# Patient Record
Sex: Female | Born: 1995 | Race: Black or African American | Hispanic: No | Marital: Married | Smoking: Never smoker
Health system: Southern US, Community
[De-identification: ages and names within clinical notes are randomized; demographics above are authoritative.]

---

## 2015-02-04 ENCOUNTER — Emergency Department
Admission: EM | Admit: 2015-02-04 | Discharge: 2015-02-04 | Disposition: A | Discharge status: Home / Self Care | Payer: Medicaid Other | Attending: Student | Admitting: Student

## 2015-02-04 ENCOUNTER — Encounter: Payer: Self-pay | Admitting: Emergency Medicine

## 2015-02-04 DIAGNOSIS — S91101A Unspecified open wound of right great toe without damage to nail, initial encounter: Secondary | ICD-10-CM | POA: Diagnosis not present

## 2015-02-04 DIAGNOSIS — Y9289 Other specified places as the place of occurrence of the external cause: Secondary | ICD-10-CM | POA: Diagnosis not present

## 2015-02-04 DIAGNOSIS — Y998 Other external cause status: Secondary | ICD-10-CM | POA: Diagnosis not present

## 2015-02-04 DIAGNOSIS — W109XXA Fall (on) (from) unspecified stairs and steps, initial encounter: Secondary | ICD-10-CM | POA: Diagnosis not present

## 2015-02-04 DIAGNOSIS — Z23 Encounter for immunization: Secondary | ICD-10-CM | POA: Diagnosis not present

## 2015-02-04 DIAGNOSIS — Y9302 Activity, running: Secondary | ICD-10-CM | POA: Insufficient documentation

## 2015-02-04 DIAGNOSIS — S91109A Unspecified open wound of unspecified toe(s) without damage to nail, initial encounter: Secondary | ICD-10-CM

## 2015-02-04 DIAGNOSIS — S99921A Unspecified injury of right foot, initial encounter: Secondary | ICD-10-CM | POA: Diagnosis present

## 2015-02-04 MED ORDER — CEPHALEXIN 250 MG PO CAPS: 250.0000 mg | ORAL_CAPSULE | Freq: Three times a day (TID) | ORAL | Status: AC

## 2015-02-04 MED ORDER — TETANUS-DIPHTH-ACELL PERTUSSIS 5-2.5-18.5 LF-MCG/0.5 IM SUSP
INTRAMUSCULAR | Status: AC
  Filled 2015-02-04: qty 0.5

## 2015-02-04 MED ORDER — TETANUS-DIPHTH-ACELL PERTUSSIS 5-2.5-18.5 LF-MCG/0.5 IM SUSP
0.5000 mL | Freq: Once | INTRAMUSCULAR | Status: AC
  Administered 2015-02-04: 0.5 mL via INTRAMUSCULAR

## 2015-02-04 NOTE — ED Notes (Signed)
Pt states she was running up the steps at home and missed the step and toenail got caught on step and she fell. Happened on Sunday afternoon/evening. Took Advil 200mg  at 11pm last night.  Put peroxide on it last night after she cut it.

## 2015-02-04 NOTE — ED Provider Notes (Signed)
[facility] Emergency Department Provider Note  ____________________________________________  Time seen: 1700  I have reviewed the triage vital signs and the nursing notes.   HISTORY  Chief Complaint Laceration   HPI Beth Newton is a 19 y.o. female today with complaint of right great toe pain. She states she was running up the steps last night when her right great toe caught the step and she fell.  She took Advil one tablet at 11 PM last night. She has used peroxide and it last night. She is unsure of her last tetanus shot. She rates her pain a 3 out of 10 at present. She denies head trauma or loss of consciousness.   History reviewed. No pertinent past medical history.  There are no active problems to display for this patient.   No past surgical history on file.  Current Outpatient Rx  Name  Route  Sig  Dispense  Refill  . cephALEXin (KEFLEX) 250 MG capsule   Oral   Take 1 capsule (250 mg total) by mouth 3 (three) times daily.   21 capsule   0     Allergies Review of patient's allergies indicates no known allergies.  No family history on file.  Social History History  Substance Use Topics  . Smoking status: Never Smoker   . Smokeless tobacco: Not on file  . Alcohol Use: No    Review of Systems Constitutional: No fever/chills Eyes: No visual changes. ENT: No sore throat. Cardiovascular: Denies chest pain. Respiratory: Denies shortness of breath. Gastrointestinal: No abdominal pain.  No nausea, no vomiting. Musculoskeletal: Negative for back pain. Skin: Negative for rash. Neurological: Negative for headaches, focal weakness or numbness.  10-point ROS otherwise negative.  ____________________________________________   PHYSICAL EXAM:  VITAL SIGNS: ED Triage Vitals  Enc Vitals Group     BP 02/04/15 1607 120/78 mmHg     Pulse Rate 02/04/15 1607 81     Resp 02/04/15 1607 18     Temp 02/04/15 1607 98.5 F (36.9 C)     Temp  Source 02/04/15 1607 Oral     SpO2 02/04/15 1607 98 %     Weight 02/04/15 1607 200 lb (90.719 kg)     Height 02/04/15 1607 5\' 7"  (1.702 m)     Head Cir --      Peak Flow --      Pain Score 02/04/15 1613 3     Pain Loc --      Pain Edu? --      Excl. in [place]? --     Constitutional: Alert and oriented. Well appearing and in no acute distress. Eyes: Conjunctivae are normal. PERRL. EOMI. Head: Atraumatic. Nose: No congestion/rhinnorhea. Neck: No stridor.   Respiratory: Normal respiratory effort.  No retractions. Lungs CTAB. Gastrointestinal: Soft and nontender. No distention. Musculoskeletal: No lower extremity tenderness nor edema.  No joint effusions. Neurologic:  Normal speech and language. No gross focal neurologic deficits are appreciated. Speech is normal. No gait instability. Skin:  Skin is warm. On the distal portion of her right great toe there is a very superficial skin avulsion without any active bleeding. Skin is nonviable There is no gross deformity of the toe. Nail appears to be intact. Psychiatric: Mood and affect are normal. Speech and behavior are normal.  ____________________________________________   LABS (all labs ordered are listed, but only abnormal results are displayed)  Labs Reviewed - No data to display   PROCEDURES  Procedure(s) performed: None  Critical Care performed:  No  ____________________________________________   INITIAL IMPRESSION / ASSESSMENT AND PLAN / ED COURSE  Pertinent labs & imaging results that were available during my care of the patient were reviewed by me and considered in my medical decision making (see chart for details).  Patient refuses to allow examiner to touch her toe. There is no gross deformity of the toe. Patient initially refuses her tdap until  explained what this is to prevent. ____________________________________________   FINAL CLINICAL IMPRESSION(S) / ED DIAGNOSES  Final diagnoses:  Avulsion of skin of toe,  initial encounter      Tommi Rumps, PA-C 02/04/15 1716  Gayla Doss, MD 02/04/15 2150

## 2017-06-01 ENCOUNTER — Encounter: Payer: Self-pay | Admitting: Emergency Medicine

## 2017-06-01 ENCOUNTER — Emergency Department
Admission: EM | Admit: 2017-06-01 | Discharge: 2017-06-01 | Disposition: A | Discharge status: Home / Self Care | Payer: Self-pay | Attending: Student in an Organized Health Care Education/Training Program | Admitting: Student in an Organized Health Care Education/Training Program

## 2017-06-01 DIAGNOSIS — J029 Acute pharyngitis, unspecified: Secondary | ICD-10-CM

## 2017-06-01 DIAGNOSIS — J039 Acute tonsillitis, unspecified: Secondary | ICD-10-CM | POA: Insufficient documentation

## 2017-06-01 MED ORDER — AMOXICILLIN 500 MG PO CAPS: 500.0000 mg | ORAL_CAPSULE | Freq: Two times a day (BID) | ORAL | 0 refills | Status: AC

## 2017-06-01 MED ORDER — CHLORHEXIDINE GLUCONATE 0.12% ORAL RINSE (MEDLINE KIT): 15.0000 mL | Freq: Two times a day (BID) | OROMUCOSAL | 0 refills | Status: DC

## 2017-06-01 NOTE — ED Triage Notes (Signed)
Pt reports feels fine, no throat pain but looked in throat and it scared her so came to get checked. No trouble swallowing or breathing.  Tonsils are enlarged with exudate. No fevers.

## 2017-06-01 NOTE — ED Provider Notes (Signed)
[facility] Emergency Department Provider Note   ____________________________________________   I have reviewed the triage vital signs and the nursing notes.   HISTORY  Chief Complaint Sore Throat    HPI Beth Newton is a 21 y.o. female presents to the emergency department with sore throat and swollen lymph nodes bilaterally that she noted developing over the last several days. Patient also noted development of exudate on tonsils. Patient denies a past history of recurrent tonsillitis.Patient denies fever,chills, congestion, sinus pressure,nausea, vomiting or headaches. 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 Patient is also working 40 hours a week. Patient denies any abdominal pain, constipation or diarrhea. Patient denies vision changes, chest pain, chest tightness, shortness of breath.  History reviewed. No pertinent past medical history.  There are no active problems to display for this patient.   History reviewed. No pertinent surgical history.  Prior to Admission medications   Medication Sig Start Date End Date Taking? Authorizing Provider  amoxicillin (AMOXIL) 500 MG capsule Take 1 capsule (500 mg total) by mouth 2 (two) times daily. 06/01/17 06/11/17  Little, Traci M, PA-C  chlorhexidine gluconate, MEDLINE KIT, (PERIDEX) 0.12 % solution Use as directed 15 mLs in the mouth or throat 2 (two) times daily. 06/01/17   Little, Traci M, PA-C    Allergies Patient has no known allergies.  History reviewed. No pertinent family history.  Social History Social History  Substance Use Topics  . Smoking status: Never Smoker  . Smokeless tobacco: Never Used  . Alcohol use No    Review of Systems Constitutional: Negative for fever/chills Eyes: No visual changes. ENT:  Positive for sore throat and negative difficulty swallowing. Inflamed and pain tonsils, bilaterally.  Cardiovascular: Denies chest  pain. Respiratory: Denies cough. Denies shortness of breath. Skin: Negative for rash. Neurological: Negative for headaches.  ____________________________________________   PHYSICAL EXAM:  VITAL SIGNS: ED Triage Vitals  Enc Vitals Group     BP 06/01/17 1641 137/81     Pulse Rate 06/01/17 1641 (!) 106     Resp 06/01/17 1641 18     Temp 06/01/17 1641 99.5 F (37.5 C)     Temp Source 06/01/17 1641 Oral     SpO2 06/01/17 1641 100 %     Weight 06/01/17 1642 200 lb (90.7 kg)     Height 06/01/17 1642 5' 7" (1.702 m)     Head Circumference --      Peak Flow --      Pain Score --      Pain Loc --      Pain Edu? --      Excl. in [place]? --     Constitutional: Alert and oriented. Well appearing and in no acute distress.  Eyes: Conjunctivae are normal. PERRL. EOMI  Head: Normocephalic and atraumatic. ENT:      Ears: Canals clear. TMs intact bilaterally.      Nose: No congestion/rhinnorhea.      Mouth/Throat: Mucous membranes are moist. Oropharynx erythematous. Tonsils erythematous, edematous with exudate. Uvula midline. Neck:Supple. No thyromegaly. No stridor.  Cardiovascular: Normal rate, regular rhythm. Normal S1 and S2.  Good peripheral circulation. Respiratory: nonproductive cough.Normal respiratory effort without tachypnea or retractions. Lungs CTAB. No wheezes/rales/rhonchi. Good air entry to the bases with no decreased or absent breath sounds. Hematological/Lymphatic/Immunological: No cervical lymphadenopathy. Cardiovascular: Normal rate, regular rhythm. Normal distal pulses. Gastrointestinal: Bowel sounds 4 quadrants. Soft and nontender to palpation.  Neurologic: Normal speech and  language.  Skin:  Skin is warm, dry and intact. No rash noted. Psychiatric: Mood and affect are normal. Speech and behavior are normal. Patient exhibits appropriate insight and judgement.  ____________________________________________   LABS (all labs ordered are listed, but only abnormal results are  displayed)  Labs Reviewed - No data to display ____________________________________________  EKG none ____________________________________________  RADIOLOGY none ____________________________________________   PROCEDURES  Procedure(s) performed: no    Critical Care performed: no ____________________________________________   INITIAL IMPRESSION / ASSESSMENT AND PLAN / ED COURSE  Pertinent labs & imaging results that were available during my care of the patient were reviewed by me and considered in my medical decision making (see chart for details).  Patient presents to the emergency department sore throat, cough, nasal congestion and edematous symmetrical tonsils with exudate. History and physical exam are reassuring symptoms are consistent with acute tonsillitis. Patient will be prescribed amoxicillin and advised to take over Ibuprofen or Tylenol for pain and fever as needed. Patient will also be encourage to rest, and rehydrate as a part of supportive care. Physical exam and vital signs were reassuring during reassessment prior to discharge. Patient informed of clinical course, understand medical decision-making process, and agree with plan. Patient was advised to follow up with PCP and was also advised to return to the emergency department for symptoms that change or worsen.     ____________________________________________   FINAL CLINICAL IMPRESSION(S) / ED DIAGNOSES  Final diagnoses:  Tonsillitis  Sore throat       NEW MEDICATIONS STARTED DURING THIS VISIT:  Discharge Medication List as of 06/01/2017  6:11 PM    START taking these medications   Details  amoxicillin (AMOXIL) 500 MG capsule Take 1 capsule (500 mg total) by mouth 2 (two) times daily., Starting Tue 06/01/2017, Until Fri 06/11/2017, Print         Note:  This document was prepared using Dragon voice recognition software and may include unintentional dictation errors.    Little, Laroy Apple,  PA-C 06/01/17 Lona Kettle    Merlyn Lot, MD 06/03/17 [phone]

## 2017-06-01 NOTE — Discharge Instructions (Signed)
Take medication as prescribed. Return to emergency department if symptoms worsen and follow-up with PCP as needed.    Get help right away if: You have any new symptoms such as: Vomiting Very bad headache Stiff neck Chest pain Trouble breathing or swallowing You have very bad throat pain and also have drooling or voice changes. You have very bad pain that is not helped by medicine. You cannot fully open your mouth. You have redness, swelling, or severe pain anywhere in your neck.

## 2017-06-01 NOTE — ED Notes (Signed)
Pt stating that she came in because her "tonsils are swollen and I'm freaking out about it." Pt stating that she feels perfectly fine but she just can see that her tonsils are enlarged.

## 2018-09-18 ENCOUNTER — Encounter (HOSPITAL_COMMUNITY): Payer: Self-pay | Admitting: Emergency Medicine

## 2018-09-18 ENCOUNTER — Other Ambulatory Visit: Payer: Self-pay

## 2018-09-18 ENCOUNTER — Emergency Department (HOSPITAL_BASED_OUTPATIENT_CLINIC_OR_DEPARTMENT_OTHER): Payer: Managed Care, Other (non HMO)

## 2018-09-18 ENCOUNTER — Emergency Department (HOSPITAL_COMMUNITY)
Admission: EM | Admit: 2018-09-18 | Discharge: 2018-09-18 | Disposition: A | Discharge status: Home / Self Care | Payer: Managed Care, Other (non HMO) | Attending: Emergency Medicine | Admitting: Emergency Medicine

## 2018-09-18 ENCOUNTER — Encounter (HOSPITAL_COMMUNITY): Payer: Self-pay | Admitting: Pharmacy Technician

## 2018-09-18 ENCOUNTER — Ambulatory Visit (INDEPENDENT_AMBULATORY_CARE_PROVIDER_SITE_OTHER)
Admission: EM | Admit: 2018-09-18 | Discharge: 2018-09-18 | Disposition: A | Discharge status: Home / Self Care | Payer: Managed Care, Other (non HMO) | Source: Home / Self Care

## 2018-09-18 DIAGNOSIS — M7989 Other specified soft tissue disorders: Secondary | ICD-10-CM

## 2018-09-18 DIAGNOSIS — M79604 Pain in right leg: Secondary | ICD-10-CM | POA: Insufficient documentation

## 2018-09-18 DIAGNOSIS — R52 Pain, unspecified: Secondary | ICD-10-CM

## 2018-09-18 DIAGNOSIS — A46 Erysipelas: Secondary | ICD-10-CM | POA: Diagnosis not present

## 2018-09-18 MED ORDER — CEPHALEXIN 250 MG PO CAPS
500.0000 mg | ORAL_CAPSULE | Freq: Once | ORAL | Status: AC
  Administered 2018-09-18: 500 mg via ORAL
  Filled 2018-09-18: qty 2

## 2018-09-18 MED ORDER — CEPHALEXIN 500 MG PO CAPS: 500.0000 mg | ORAL_CAPSULE | Freq: Four times a day (QID) | ORAL | 0 refills | Status: AC

## 2018-09-18 NOTE — ED Triage Notes (Signed)
Pt arrives via POV with reports of redness, swelling and pain to RLE onset several weeks ago. Denies CP and SOB.

## 2018-09-18 NOTE — ED Notes (Signed)
Patient verbalizes understanding of discharge instructions. Opportunity for questioning and answers were provided. 

## 2018-09-18 NOTE — ED Provider Notes (Signed)
[facility]ENC[facility] Provider Note   CSN: [id number] Arrival date & time: 09/18/18  1155     History   Chief Complaint Chief Complaint  Patient presents with  . Leg Pain    HPI Beth Newton is a 23 y.o. female.  HPI   She presents for evaluation of pain and swelling in the right leg present for 2 weeks without known trauma.  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  She has been trying to elevate the leg to help decrease the swelling without improvement.  She denies fever, chills, cough, shortness of breath, weakness or dizziness.  No prior similar problems.  There are no other known modifying factors.  History reviewed. No pertinent past medical history.  There are no active problems to display for this patient.   History reviewed. No pertinent surgical history.   OB History   No obstetric history on file.      Home Medications    Prior to Admission medications   Medication Sig Start Date End Date Taking? Authorizing Provider  cephALEXin (KEFLEX) 500 MG capsule Take 1 capsule (500 mg total) by mouth 4 (four) times daily for 7 days. 09/18/18 09/25/18  Little, Ambrose Finlandachel Morgan, MD    Family History No family history on file.  Social History Social History   Tobacco Use  . Smoking status: Never Smoker  . Smokeless tobacco: Never Used  Substance Use Topics  . Alcohol use: No  . Drug use: Never     Allergies   Patient has no known allergies.   Review of Systems Review of Systems  All other systems reviewed and are negative.    Physical Exam Updated Vital Signs BP 101/80   Pulse 88   Temp 99.1 F (37.3 C) (Oral)   Resp 16   LMP 08/31/2018   SpO2 100%   Physical Exam Vitals signs and nursing note reviewed.  Constitutional:      Appearance: She is well-developed.  HENT:     Head: Normocephalic and atraumatic.     Right Ear: External ear normal.     Left  Ear: External ear normal.  Eyes:     Conjunctiva/sclera: Conjunctivae normal.     Pupils: Pupils are equal, round, and reactive to light.  Neck:     Musculoskeletal: Normal range of motion and neck supple.     Trachea: Phonation normal.  Cardiovascular:     Rate and Rhythm: Normal rate.  Pulmonary:     Effort: Pulmonary effort is normal.  Musculoskeletal: Normal range of motion.     Comments: Right lower leg tender and swollen from knee to foot.  Reddened area anterior mid shin, indistinct margins, extending to the right lateral midfoot.  Skin:    General: Skin is warm and dry.  Neurological:     Mental Status: She is alert and oriented to person, place, and time.     Cranial Nerves: No cranial nerve deficit.     Sensory: No sensory deficit.     Motor: No abnormal muscle tone.     Coordination: Coordination normal.  Psychiatric:        Behavior: Behavior normal.        Thought Content: Thought content normal.        Judgment: Judgment normal.      ED Treatments / Results  Labs (all labs ordered are listed, but only abnormal results are displayed) Labs  Reviewed - No data to display  EKG None  Radiology Vas [place] Lower Extremity Venous (dvt)  Result Date: 09/19/2018  Lower Venous Study Indications: Pain, and Swelling.  Comparison Study: No prior study on file Performing Technologist: Sherren Kerns RVS  Examination Guidelines: A complete evaluation includes B-mode imaging, spectral Doppler, color Doppler, and power Doppler as needed of all accessible portions of each vessel. Bilateral testing is considered an integral part of a complete examination. Limited examinations for reoccurring indications may be performed as noted.  Right Venous Findings: +---------+---------------+---------+-----------+----------+-------+          CompressibilityPhasicitySpontaneityPropertiesSummary +---------+---------------+---------+-----------+----------+-------+ CFV      Full           Yes       Yes                          +---------+---------------+---------+-----------+----------+-------+ SFJ      Full                                                 +---------+---------------+---------+-----------+----------+-------+ FV Prox  Full                                                 +---------+---------------+---------+-----------+----------+-------+ FV Mid   Full                                                 +---------+---------------+---------+-----------+----------+-------+ FV DistalFull                                                 +---------+---------------+---------+-----------+----------+-------+ PFV      Full                                                 +---------+---------------+---------+-----------+----------+-------+ POP      Full           Yes      Yes                          +---------+---------------+---------+-----------+----------+-------+ PTV      Full                                                 +---------+---------------+---------+-----------+----------+-------+ PERO     Full                                                 +---------+---------------+---------+-----------+----------+-------+ GSV      Full                                                 +---------+---------------+---------+-----------+----------+-------+  SSV      Full                                                 +---------+---------------+---------+-----------+----------+-------+  Left Venous Findings: +---+---------------+---------+-----------+----------+-------+    CompressibilityPhasicitySpontaneityPropertiesSummary +---+---------------+---------+-----------+----------+-------+ CFVFull           Yes      Yes                          +---+---------------+---------+-----------+----------+-------+    Summary: Right: There is no evidence of deep vein thrombosis in the lower extremity.There is no evidence of superficial  venous thrombosis. Ultrasound characteristics of enlarged lymph nodes are noted in the groin.  *See table(s) above for measurements and observations. Electronically signed by Waverly Ferrari MD on 09/19/2018 at 10:43:45 AM.    Final     Procedures Procedures (including critical care time)  Medications Ordered in ED Medications  cephALEXin (KEFLEX) capsule 500 mg (500 mg Oral Given 09/18/18 1558)     Initial Impression / Assessment and Plan / ED Course  I have reviewed the triage vital signs and the nursing notes.  Pertinent labs & imaging results that were available during my care of the patient were reviewed by me and considered in my medical decision making (see chart for details).      Patient Vitals for the past 24 hrs:  BP Temp Temp src Pulse Resp SpO2  09/18/18 1809 101/80 99.1 F (37.3 C) Oral 88 16 100 %  09/18/18 1745 - - - 86 - 100 %  09/18/18 1730 - - - 96 - 100 %  09/18/18 1715 - - - 93 - 100 %  09/18/18 1700 - - - 90 - 100 %  09/18/18 1645 - - - (!) 105 - 100 %  09/18/18 1630 - - - 86 - 100 %  09/18/18 1615 - - - 88 - 100 %  09/18/18 1600 - - - 100 - 100 %  09/18/18 1545 - - - 64 - 100 %  09/18/18 1530 - - - 72 - 100 %  09/18/18 1515 - - - 88 - 100 %  09/18/18 1300 97/73 - - 94 - 100 %  09/18/18 1220 - - - 99 - 100 %  09/18/18 1215 120/78 - - 92 - 100 %      Medical Decision Making: Right leg swelling and redness with indolent course. Clinically has appearance of streptococcal infection, erysipelas. DVT study ordered for occult thrombus findings. Doubt sepsis, or metabolic instability.  CRITICAL CARE- No Performed by: Mancel Bale   Nursing Notes Reviewed/ Care Coordinated Applicable Imaging Reviewed Interpretation of Laboratory Data incorporated into ED treatment'  Plan; Care yo Dr. Clarene Duke to dispo after U/S    Final Clinical Impressions(s) / ED Diagnoses   Final diagnoses:  Erysipelas of right lower extremity    ED Discharge Orders          Ordered    cephALEXin (KEFLEX) 500 MG capsule  4 times daily     09/18/18 1755           Mancel Bale, MD 09/19/18 1101

## 2018-09-18 NOTE — ED Provider Notes (Signed)
I received this patient in signout from Dr. Effie Shy.  We were awaiting DVT ultrasound results which were negative for thrombosis.  Marked patient's skin with skin marker to allow her to assess for improvement or worsening symptoms at home.  Per Dr. Mable Paris instructions, will provide with keflex to treat erysipelas.  I discussed supportive measures including the importance of elevating leg and staying off of leg as often as possible.  Extensively reviewed return precautions including any worsening symptoms or new symptoms such as fever.  She voiced understanding.   Little, Ambrose Finland, MD 09/18/18 1757

## 2018-09-18 NOTE — ED Provider Notes (Signed)
[facility]    CSN: [id number] Arrival date & time: 09/18/18  1016     History   Chief Complaint Chief Complaint  Patient presents with  . Ankle Pain    HPI Beth Newton is a 23 y.o. female.   Patient is a 23 year old female and is otherwise healthy.  She presents with right lower extremity swelling, tenderness, erythema, increased warmth.  This has  been present and worsening over the past week.  She had an injury at work approximately 3 weeks ago where she was lifting up a box and felt a pull in her right calf area.  She was treating the injury with heat.  She does stand on her feet long hours at work.  She is still able to ambulate on the leg.  There is no open wounds or rashes.  She has a family history of DVTs.  She denies any hormone use, smoking, recent long distance traveling.  She denies any chest pain, shortness of breath, fevers.  ROS per HPI      History reviewed. No pertinent past medical history.  There are no active problems to display for this patient.   History reviewed. No pertinent surgical history.  OB History   No obstetric history on file.      Home Medications    Prior to Admission medications   Not on File    Family History History reviewed. No pertinent family history.  Social History Social History   Tobacco Use  . Smoking status: Never Smoker  . Smokeless tobacco: Never Used  Substance Use Topics  . Alcohol use: No  . Drug use: Never     Allergies   Patient has no known allergies.   Review of Systems Review of Systems   Physical Exam Triage Vital Signs ED Triage Vitals  Enc Vitals Group     BP 09/18/18 1112 139/75     Pulse Rate 09/18/18 1112 (!) 103     Resp 09/18/18 1112 18     Temp 09/18/18 1112 99 F (37.2 C)     Temp Source 09/18/18 1112 Oral     SpO2 09/18/18 1112 100 %     Weight --      Height --      Head Circumference --      Peak Flow --      Pain Score 09/18/18 1113 9     Pain Loc --       Pain Edu? --      Excl. in GC? --    No data found.  Updated Vital Signs BP 139/75 (BP Location: Right Arm)   Pulse (!) 103   Temp 99 F (37.2 C) (Oral)   Resp 18   LMP 08/31/2018   SpO2 100%   Visual Acuity Right Eye Distance:   Left Eye Distance:   Bilateral Distance:    Right Eye Near:   Left Eye Near:    Bilateral Near:     Physical Exam Vitals signs and nursing note reviewed.  Constitutional:      General: She is not in acute distress.    Appearance: Normal appearance. She is not toxic-appearing.  HENT:     Head: Normocephalic and atraumatic.  Eyes:     Conjunctiva/sclera: Conjunctivae normal.  Neck:     Musculoskeletal: Normal range of motion.  Cardiovascular:     Rate and Rhythm: Tachycardia present.     Heart sounds: Normal heart sounds.  Pulmonary:  Effort: Pulmonary effort is normal.     Breath sounds: Normal breath sounds.  Musculoskeletal:        General: Swelling and tenderness present.     Right lower leg: Edema present.     Comments: Tenderness to the right calf and anterior lower leg.  There is erythema and increased warmth.  Sensation and pedal pulse present. Negative Homans. Left lower extremity appears normal without tenderness but there is some mild swelling at the lateral malleolus.  Skin:    Findings: Erythema present.     Comments: See picture for detail.  Neurological:     Mental Status: She is alert.  Psychiatric:        Mood and Affect: Mood normal.        UC Treatments / Results  Labs (all labs ordered are listed, but only abnormal results are displayed) Labs Reviewed - No data to display  EKG None  Radiology No results found.  Procedures Procedures (including critical care time)  Medications Ordered in UC Medications - No data to display  Initial Impression / Assessment and Plan / UC Course  I have reviewed the triage vital signs and the nursing notes.  Pertinent labs & imaging results that were  available during my care of the patient were reviewed by me and considered in my medical decision making (see chart for details).    Cellulitis vs DVT Sending patient to the ER to rule out DVT based on Patient family history and physical exam.  Final Clinical Impressions(s) / UC Diagnoses   Final diagnoses:  Leg swelling  Pain of right lower extremity     Discharge Instructions     I would like  for you to go to the emergency room for further evaluation and rule out a blood clot in your leg.     ED Prescriptions    None     Controlled Substance Prescriptions [place] Controlled Substance Registry consulted? Not Applicable   Janace Aris, NP 09/18/18 1152

## 2018-09-18 NOTE — ED Triage Notes (Signed)
The patient presented to the [facility] with a complaint of right lower leg and ankle pain x 3 weeks. The patient reported that she lifted a box at work earlier and it has been hurting since. The patient presented to the [facility] with redness, swelling, and heat to the calf area.

## 2018-09-18 NOTE — Progress Notes (Signed)
VASCULAR LAB PRELIMINARY  PRELIMINARY  PRELIMINARY  PRELIMINARY  Right lower extremity venous duplex completed.    Preliminary report:  There is no DVT or SVT noted in the right lower extremity.  There is an enlarged lymph node noted in the right groin.  Right calf muscle tissue appears disturbed.  Gave results to Dr. Alcide Goodness, Riordan Walle, RVT 09/18/2018, 5:07 PM

## 2018-09-18 NOTE — Discharge Instructions (Addendum)
I would like  for you to go to the emergency room for further evaluation and rule out a blood clot in your leg.

## 2018-10-10 ENCOUNTER — Encounter (HOSPITAL_COMMUNITY): Payer: Self-pay | Admitting: Emergency Medicine

## 2018-10-10 ENCOUNTER — Ambulatory Visit (HOSPITAL_COMMUNITY)
Admission: EM | Admit: 2018-10-10 | Discharge: 2018-10-10 | Disposition: A | Discharge status: Home / Self Care | Payer: Managed Care, Other (non HMO) | Attending: Family Medicine | Admitting: Family Medicine

## 2018-10-10 DIAGNOSIS — S66412A Strain of intrinsic muscle, fascia and tendon of left thumb at wrist and hand level, initial encounter: Secondary | ICD-10-CM | POA: Diagnosis not present

## 2018-10-10 DIAGNOSIS — M654 Radial styloid tenosynovitis [de Quervain]: Secondary | ICD-10-CM | POA: Diagnosis not present

## 2018-10-10 LAB — POCT PREGNANCY, URINE: Preg Test, Ur: NEGATIVE

## 2018-10-10 MED ORDER — IBUPROFEN 600 MG PO TABS: 600.0000 mg | ORAL_TABLET | Freq: Four times a day (QID) | ORAL | 0 refills | Status: DC | PRN

## 2018-10-10 NOTE — ED Triage Notes (Signed)
Pt sts left wrist pain starting today

## 2018-10-10 NOTE — ED Provider Notes (Signed)
Patient: Beth Newton MRN: [id number] DOB: [birth year] PCP: Patient, No Pcp Per     Subjective:  Chief Complaint  Patient presents with  . Wrist Pain    HPI: The patient is a 23 y.o. female who presents today for left hand pain. She is overall healthy with no medical history.  Pain started about one week ago. She is right handed. She denies any injury. 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 It has never hurt her before this time. She has not taken anything for the pain. Pain is 7/10 and is constant. Pain is mainly in her palm and then on the dorsal side of the hand. Nothing makes it better or worse. She has intact hand grip, but it hurts. She grips a lot. Pain is worse in the AM. No numbness or tingling in the fingers.   She also wonders if she is pregnant. Her LMP was 1/16-1/23. She is 19 days into her cycle. Her breasts are tender. She just had sex last night with her husband. She has had sex nearly everyday, so possibility is there.  Review of Systems  Constitutional: Negative for chills and fever.  Respiratory: Negative for cough and shortness of breath.   Cardiovascular: Negative for chest pain and palpitations.  Gastrointestinal: Negative for abdominal pain, nausea and vomiting.  Genitourinary: Negative for dysuria, vaginal bleeding and vaginal discharge.  Musculoskeletal:       Left hand/wrist pain   Skin: Negative for rash.    Allergies Patient has No Known Allergies.  Past Medical History Patient  has no past medical history on file.  Surgical History Patient  has no past surgical history on file.  Family History Pateint's family history includes Healthy in her father and mother.  Social History Patient  reports that she has never smoked. She has never used smokeless tobacco. She reports that she does not drink alcohol or use drugs.    Objective: Vitals:   10/10/18 1902  BP: 123/63  Pulse: 77  Resp: 18  Temp:  (!) 97.5 F (36.4 C)  TempSrc: Temporal  SpO2: 100%    There is no height or weight on file to calculate BMI.  Physical Exam Vitals signs reviewed.  Constitutional:      Appearance: Normal appearance.  Musculoskeletal:     Comments: Left wrist/hand wnl. No edema/erythema.  +finklestein TTP over oppones pollicis  Hand and thumb strength intact   Neurological:     Mental Status: She is alert.    Pregnancy test: negative     Assessment/plan: The primary encounter diagnosis was Suzette Battiest disease (radial styloid tenosynovitis). A diagnosis of Strain of intrinsic muscle of left thumb was also pertinent to this visit.  1) thumb strain of left opponens pollicis: thumb spica, NSAIDs prn. Still too early to know if she is pregnant, so recommended she  Stick to tylenol until period comes.  If not better needs to see pcp.   2) de quervains disease: thumb spinca, nsaids prn. See above. Tylenol until periods comes. If not better with conservative therapy f/u with pcp.   Also recommended starting pre natal vitamin if having sex and open to pregnancy.     Orland Mustard, MD  10/10/2018    Orland Mustard, MD 10/10/18 [phone]

## 2018-12-24 ENCOUNTER — Other Ambulatory Visit: Payer: Self-pay

## 2018-12-24 ENCOUNTER — Emergency Department (HOSPITAL_COMMUNITY)
Admission: EM | Admit: 2018-12-24 | Discharge: 2018-12-24 | Disposition: A | Discharge status: Home / Self Care | Payer: Managed Care, Other (non HMO) | Attending: Emergency Medicine | Admitting: Emergency Medicine

## 2018-12-24 DIAGNOSIS — M546 Pain in thoracic spine: Secondary | ICD-10-CM

## 2018-12-24 DIAGNOSIS — M549 Dorsalgia, unspecified: Secondary | ICD-10-CM | POA: Diagnosis present

## 2018-12-24 DIAGNOSIS — Z79899 Other long term (current) drug therapy: Secondary | ICD-10-CM | POA: Insufficient documentation

## 2018-12-24 DIAGNOSIS — M545 Low back pain, unspecified: Secondary | ICD-10-CM

## 2018-12-24 MED ORDER — DIAZEPAM 5 MG PO TABS
5.0000 mg | ORAL_TABLET | Freq: Once | ORAL | Status: AC
  Administered 2018-12-24: 5 mg via ORAL
  Filled 2018-12-24: qty 1

## 2018-12-24 MED ORDER — OXYCODONE HCL 5 MG PO TABS
5.0000 mg | ORAL_TABLET | Freq: Once | ORAL | Status: AC
  Administered 2018-12-24: 5 mg via ORAL
  Filled 2018-12-24: qty 1

## 2018-12-24 MED ORDER — IBUPROFEN 800 MG PO TABS
800.0000 mg | ORAL_TABLET | Freq: Once | ORAL | Status: AC
  Administered 2018-12-24: 800 mg via ORAL
  Filled 2018-12-24: qty 1

## 2018-12-24 MED ORDER — ACETAMINOPHEN 500 MG PO TABS
1000.0000 mg | ORAL_TABLET | Freq: Once | ORAL | Status: AC
  Administered 2018-12-24: 1000 mg via ORAL
  Filled 2018-12-24: qty 2

## 2018-12-24 NOTE — Discharge Instructions (Signed)
You will hurt worse tomorrow, that is normal.  If you start having chest pain, trouble breathing abdominal pain, confusion or vomiting please return to be evaluated.   Take 4 over the counter ibuprofen tablets 3 times a day or 2 over-the-counter naproxen tablets twice a day for pain. Also take tylenol 1000mg (2 extra strength) four times a day.
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
## 2019-08-18 ENCOUNTER — Other Ambulatory Visit: Payer: Self-pay

## 2019-08-21 ENCOUNTER — Other Ambulatory Visit: Payer: Self-pay

## 2020-04-16 ENCOUNTER — Other Ambulatory Visit: Payer: Self-pay

## 2020-04-16 ENCOUNTER — Ambulatory Visit (INDEPENDENT_AMBULATORY_CARE_PROVIDER_SITE_OTHER): Payer: Self-pay

## 2020-04-16 DIAGNOSIS — Z3201 Encounter for pregnancy test, result positive: Secondary | ICD-10-CM

## 2020-04-16 DIAGNOSIS — Z3200 Encounter for pregnancy test, result unknown: Secondary | ICD-10-CM

## 2020-04-16 LAB — POCT PREGNANCY, URINE: Preg Test, Ur: POSITIVE — AB

## 2020-04-16 NOTE — Progress Notes (Signed)
Pt left urine for UPT; test is positive today. Called pt with results. Pt reports first positive home urine pregnancy test on 04/11/20. LMP of 03/04/20; 6w 1d today with EDD of 12/09/20. Pt reports mild menstrual like cramps intermittently. Reviewed with Crisoforo Oxford, CNM who states pt should continue to monitor with ectopic precautions. Explained to pt she should go to MAU with any constant pain or bleeding, unless it is minimal spotting after intercourse. Pt verbalizes understanding. Medications and allergies reviewed. Pt would like to have prenatal care with our office. Front office notified to schedule appts.   Fleet Contras RN 04/16/20

## 2020-05-05 NOTE — Progress Notes (Signed)
Chart reviewed for nurse visit. Agree with plan of care.   Marylene Land, CNM 05/05/2020 8:30 AM

## 2020-05-08 ENCOUNTER — Other Ambulatory Visit: Payer: Self-pay

## 2020-05-08 ENCOUNTER — Telehealth (INDEPENDENT_AMBULATORY_CARE_PROVIDER_SITE_OTHER): Payer: Self-pay | Admitting: *Deleted

## 2020-05-08 DIAGNOSIS — Z3490 Encounter for supervision of normal pregnancy, unspecified, unspecified trimester: Secondary | ICD-10-CM

## 2020-05-08 NOTE — Progress Notes (Signed)
I connected with  Lauria Schaible on 05/08/20 at  2:15 PM EDT by virtually and verified that I am speaking with the correct person using two identifiers.   I discussed the limitations, risks, security and privacy concerns of performing an evaluation and management service by virtually and the availability of in person appointments. I also discussed with the patient that there may be a patient responsible charge related to this service. The patient expressed understanding and agreed to proceed.   Desiraye Rolfson,RN 05/08/2020  2:14 PM

## 2020-05-08 NOTE — Patient Instructions (Signed)
  At [facility], we work as an integrated team, providing care to address both physical and emotional health. Your medical provider may refer you to see our Behavioral Health Clinician (BHC) on the same day you see your medical provider, as availability permits.  Our BHC is available to all patients, visits generally last between 20-30 minutes, but can be longer or shorter, depending on patient need. The BHC offers help with stress management, coping with symptoms of depression and anxiety, major life changes , sleep issues, changing risky behavior, grief and loss, life stress, working on personal life goals, and  behavioral health issues, as these all affect your overall health and wellness.  The BHC is NOT available for the following: FMLA paperwork, court-ordered evaluations, specialty assessments (custody or disability), letters to employers, or obtaining certification for an emotional support animal. The BHC does not provide long-term therapy. You have the right to refuse integrated behavioral health services, or to reschedule to see the BHC at a later date.  Exception: If you are having thoughts of suicide, we require that you either see the BHC for further assessment, or contract for safety with your medical provider. Confidentiality exception: If it is suspected that a child or disabled adult is being abused or neglected, we are required by law to report that to either Child Protective Services or Adult Protective Services.  If you have a diagnosis of Bipolar affective disorder, Schizophrenia, or recurrent Major depressive disorder, we will recommend that you establish care with a psychiatrist, as these are lifelong, chronic conditions, and we want your overall emotional health and medications to be more closely monitored. If you anticipate needing extended maternity leave due to mental illness, it it recommended you inform your medical provider, so  we can put in a referral to a  psychiatrist as soon as possible. The BHC is unable to recommend an extended maternity leave for mental health issues. Your medical provider or BHC may refer you to a therapist for ongoing, traditional therapy, or to a psychiatrist, for medication management, if it would benefit your overall health. Depending on your insurance, you may have a copay to see the BHC. If you are uninsured, it is recommended that you apply for financial assistance. (Forms may be requested at the front desk for in-person visits, via MyChart, or request a form during a virtual visit).  If you see the BHC more than 6 times, you will have to complete a comprehensive clinical assessment interview with the BHC to resume integrated services.  For virtual visits with the BHC, you must be physically in the state of [place] at the time of the visit. For example, if you live in [place], you will have to do an in-person visit with the BHC. If you are going out of the state or country for any reason, the BHC may see you virtually when you return to [place], but not while you are physically outside of .    

## 2020-05-08 NOTE — Progress Notes (Addendum)
Chart reviewed for nurse visit. Agree with plan of care.   Bernerd Limbo, CNM 05/09/2020 11:05 AM     I connected with  Beth Newton on 05/08/20 at  2:15 PM EDT by virtually and verified that I am speaking with the correct person using two identifiers.   I discussed the limitations, risks, security and privacy concerns of performing an evaluation and management service by virtually and the availability of in person appointments. I also discussed with the patient that there may be a patient responsible charge related to this service. The patient expressed understanding and agreed to proceed. At time of visit patient at work in her car. Provider in office at [facility].   I explained I am completing her New OB Intake today. We discussed Her EDD and that it is based on  sure LMP . I reviewed her allergies, meds, OB History, Medical /Surgical history, and appropriate screenings. I informed her of [facility] services.    She is G1  P0. I explained we will have her take her blood pressure weekly during her pregnancy.  She verified she does not  have a blood pressure cuff. We discussed she does not have insurance yet; but thinks she can be added to her partner's insurance.  I explained most private insurances do not cover bp cuff prescription and asked if she can buy one.   I asked her to bring the blood pressure cuff with her to her first ob appointment so we can show her how to use it.  Explained after her first ob appointment   we will have her take her blood pressure weekly I  explained we will have her log her blood pressures into an app that I will send her called [employer].  The  app was sent to her while on phone.    I explained she will have some visits in office and some virtually. 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 I reviewed her new ob  appointment date/ time with her , our location and to wear mask,  I explained she will have a pelvic exam, ob bloodwork, hemoglobin a1C, cbg ,pap, and  genetic testing if  desired,- she does want a panorama. I explained I will scheduled an [place] at 19 weeks and she will see the appointment later in MyChart. She voices understanding.   Shadow Stiggers,RN 05/08/2020  4:23 PM

## 2020-05-14 ENCOUNTER — Ambulatory Visit (INDEPENDENT_AMBULATORY_CARE_PROVIDER_SITE_OTHER): Payer: Self-pay | Admitting: Certified Nurse Midwife

## 2020-05-14 ENCOUNTER — Encounter: Payer: Self-pay | Admitting: Certified Nurse Midwife

## 2020-05-14 ENCOUNTER — Other Ambulatory Visit: Payer: Self-pay

## 2020-05-14 ENCOUNTER — Other Ambulatory Visit (HOSPITAL_COMMUNITY)
Admission: RE | Admit: 2020-05-14 | Discharge: 2020-05-14 | Disposition: A | Discharge status: Home / Self Care | Payer: Medicaid Other | Source: Ambulatory Visit | Attending: Certified Nurse Midwife | Admitting: Certified Nurse Midwife

## 2020-05-14 VITALS — BP 134/78 | HR 82 | Wt 216.0 lb

## 2020-05-14 DIAGNOSIS — Z3A10 10 weeks gestation of pregnancy: Secondary | ICD-10-CM

## 2020-05-14 DIAGNOSIS — Z3400 Encounter for supervision of normal first pregnancy, unspecified trimester: Secondary | ICD-10-CM

## 2020-05-14 LAB — POCT URINALYSIS DIP (DEVICE)
Bilirubin Urine: NEGATIVE
Glucose, UA: NEGATIVE mg/dL
Hgb urine dipstick: NEGATIVE
Ketones, ur: NEGATIVE mg/dL
Leukocytes,Ua: NEGATIVE
Nitrite: NEGATIVE
Protein, ur: NEGATIVE mg/dL
Specific Gravity, Urine: 1.025 (ref 1.005–1.030)
Urobilinogen, UA: 1 mg/dL (ref 0.0–1.0)
pH: 6.5 (ref 5.0–8.0)

## 2020-05-14 NOTE — Progress Notes (Signed)
   PRENATAL VISIT NOTE  Subjective:  Beth Newton is a 24 y.o. G1P0000 at [redacted]w[redacted]d being seen today for her first prenatal visit for this pregnancy.  She is currently monitored for the following issues for this low-risk pregnancy and has Supervision of low-risk pregnancy on their problem list.  Patient reports no complaints other than occasional nausea that is resolved with at-home remedies.  Contractions: Not present. Vag. Bleeding: None.  Movement: Absent. Denies leaking of fluid.   She is planning to breastfeed. Desires nothing for contraception.   The following portions of the patient's history were reviewed and updated as appropriate: allergies, current medications, past family history, past medical history, past social history, past surgical history and problem list.   Objective:   Vitals:   05/14/20 0937  BP: 134/78  Pulse: 82  Weight: 216 lb (98 kg)    Fetal Status: Fetal Heart Rate (bpm): 145   Movement: Absent     General:  Alert, oriented and cooperative. Patient is in no acute distress.  Skin: Skin is warm and dry. No rash noted.   Cardiovascular: Normal heart rate and rhythm noted  Respiratory: Normal respiratory effort, no problems with respiration noted. Clear to auscultation.   Abdomen: Soft, gravid, appropriate for gestational age. Normal bowel sounds. Non-tender. Pain/Pressure: Absent     Pelvic: Normal cervical contour, no lesions, no bleeding following pap, normal discharge  Extremities: Normal range of motion.  Edema: None  Mental Status: Normal mood and affect. Normal behavior. Normal judgment and thought content.   Assessment and Plan:  Pregnancy: G1P0000 at [redacted]w[redacted]d 1. Encounter for supervision of low-risk first pregnancy, antepartum - CBC/D/Plt+RPR+Rh+ABO+Rub Ab... - Hemoglobin A1c - Culture, OB Urine - Genetic Screening - Cytology - PAP( [place])  2. [redacted] weeks gestation of pregnancy - Discussed the normal visit cadence for prenatal care - Discussed  the nature of our practice with multiple providers including residents and students  - Desires to see midwives as often as possible, wants an in-person visit next to hear heart tones - Desires waterbirth - encouraged class in 3rd trimester, comprehensive childbirth class, and use of a doula  Preterm labor/first trimester warning symptoms and general obstetric precautions including but not limited to vaginal bleeding, contractions, leaking of fluid and fetal movement were reviewed in detail with the patient. Please refer to After Visit Summary for other counseling recommendations.   Return in about 4 weeks (around 06/11/2020) for IN-PERSON, LOB.  Future Appointments  Date Time Provider Department Center  06/11/2020  8:35 AM Currie Paris, NP [facility]  07/15/2020  7:45 AM [facility]   Edd Arbour, CNM, MSN, [facility] 05/14/20 11:32 AM

## 2020-05-15 LAB — CYTOLOGY - PAP
Chlamydia: NEGATIVE
Comment: NEGATIVE
Comment: NORMAL
Diagnosis: NEGATIVE
Neisseria Gonorrhea: NEGATIVE

## 2020-05-15 LAB — CBC/D/PLT+RPR+RH+ABO+RUB AB...
Antibody Screen: NEGATIVE
Basophils Absolute: 0 10*3/uL (ref 0.0–0.2)
Basos: 1 %
EOS (ABSOLUTE): 0 10*3/uL (ref 0.0–0.4)
Eos: 1 %
HCV Ab: <0.1 s/co ratio (ref 0.0–0.9)
HIV Screen 4th Generation wRfx: NONREACTIVE
Hematocrit: 35.8 % (ref 34.0–46.6)
Hemoglobin: 11.6 g/dL (ref 11.1–15.9)
Hepatitis B Surface Ag: NEGATIVE
Immature Grans (Abs): 0 10*3/uL (ref 0.0–0.1)
Immature Granulocytes: 0 %
Lymphocytes Absolute: 1.6 10*3/uL (ref 0.7–3.1)
Lymphs: 26 %
MCH: 28 pg (ref 26.6–33.0)
MCHC: 32.4 g/dL (ref 31.5–35.7)
MCV: 87 fL (ref 79–97)
Monocytes Absolute: 0.4 10*3/uL (ref 0.1–0.9)
Monocytes: 6 %
Neutrophils Absolute: 4.3 10*3/uL (ref 1.4–7.0)
Neutrophils: 66 %
Platelets: 278 10*3/uL (ref 150–450)
RBC: 4.14 x10E6/uL (ref 3.77–5.28)
RDW: 12.8 % (ref 11.7–15.4)
RPR Ser Ql: NONREACTIVE
Rh Factor: POSITIVE
Rubella Antibodies, IGG: 4.16 index (ref >0.99)
WBC: 6.4 10*3/uL (ref 3.4–10.8)

## 2020-05-15 LAB — HEMOGLOBIN A1C
Est. average glucose Bld gHb Est-mCnc: 94 mg/dL
Hgb A1c MFr Bld: 4.9 % (ref 4.8–5.6)

## 2020-05-15 LAB — HCV INTERPRETATION

## 2020-05-16 LAB — CULTURE, OB URINE

## 2020-05-16 LAB — URINE CULTURE, OB REFLEX

## 2020-05-21 ENCOUNTER — Encounter: Payer: Self-pay | Admitting: General Practice

## 2020-06-03 ENCOUNTER — Encounter: Payer: Self-pay | Admitting: *Deleted

## 2020-06-11 ENCOUNTER — Ambulatory Visit (INDEPENDENT_AMBULATORY_CARE_PROVIDER_SITE_OTHER): Payer: Self-pay | Admitting: Nurse Practitioner

## 2020-06-11 ENCOUNTER — Telehealth: Payer: Self-pay | Admitting: Lactation Services

## 2020-06-11 ENCOUNTER — Encounter: Payer: Self-pay | Admitting: *Deleted

## 2020-06-11 ENCOUNTER — Other Ambulatory Visit: Payer: Self-pay

## 2020-06-11 VITALS — BP 127/86 | HR 96 | Wt 225.3 lb

## 2020-06-11 DIAGNOSIS — Z3A14 14 weeks gestation of pregnancy: Secondary | ICD-10-CM

## 2020-06-11 DIAGNOSIS — O26892 Other specified pregnancy related conditions, second trimester: Secondary | ICD-10-CM

## 2020-06-11 DIAGNOSIS — K5909 Other constipation: Secondary | ICD-10-CM

## 2020-06-11 DIAGNOSIS — Z7189 Other specified counseling: Secondary | ICD-10-CM

## 2020-06-11 DIAGNOSIS — Z3490 Encounter for supervision of normal pregnancy, unspecified, unspecified trimester: Secondary | ICD-10-CM

## 2020-06-11 HISTORY — DX: Other constipation: K59.09

## 2020-06-11 MED ORDER — DOCUSATE CALCIUM 240 MG PO CAPS: 240.0000 mg | ORAL_CAPSULE | Freq: Every day | ORAL | 2 refills | Status: DC

## 2020-06-11 NOTE — Telephone Encounter (Signed)
Called patient to give her results of [facility] Screening. 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   Patient did not answer. LM for patient to call the office for results. My Chart Message sent.

## 2020-06-11 NOTE — Progress Notes (Signed)
    Subjective:  Beth Newton is a 24 y.o. G1P0000 at [redacted]w[redacted]d being seen today for ongoing prenatal care.  She is currently monitored for the following issues for this low-risk pregnancy and has Supervision of low-risk pregnancy on their problem list.  Patient reports no complaints.  Contractions: Not present. Vag. Bleeding: None.  Movement: Absent. Denies leaking of fluid.   The following portions of the patient's history were reviewed and updated as appropriate: allergies, current medications, past family history, past medical history, past social history, past surgical history and problem list. Problem list updated.  Objective:   Vitals:   06/11/20 0854  BP: 127/86  Pulse: 96  Weight: 225 lb 4.8 oz (102.2 kg)    Fetal Status: Fetal Heart Rate (bpm): 142   Movement: Absent     General:  Alert, oriented and cooperative. Patient is in no acute distress.  Skin: Skin is warm and dry. No rash noted.   Cardiovascular: Normal heart rate noted  Respiratory: Normal respiratory effort, no problems with respiration noted  Abdomen: Soft, gravid, appropriate for gestational age. Pain/Pressure: Absent     Pelvic:  Cervical exam deferred        Extremities: Normal range of motion.  Edema: None  Mental Status: Normal mood and affect. Normal behavior. Normal judgment and thought content.   Urinalysis:      Assessment and Plan:  Pregnancy: G1P0000 at [redacted]w[redacted]d  1. Encounter for supervision of low-risk pregnancy, antepartum  2. Other constipation Prescribed stool softener - offerred also Miralax every other day   COVID-19 Vaccine Counseling: The patient was counseled on the potential benefits and lack of known risks of COVID vaccination, during pregnancy and breastfeeding, during today's visit. The patient's questions and concerns were addressed today, including safety of the vaccination and potential side effects as they have been published by ACOG and SMFM. The patient has been informed that there  have not been any documented vaccine related injuries, deaths or birth defects to infant or mom after receiving the COVID-19 vaccine to date. The patient has been made aware that although she is not at increased risk of contracting COVID-19 during pregnancy, she is at increased risk of developing severe disease and complications if she contracts COVID-19 while pregnant. All patient questions were addressed during our visit today. The patient is still unsure of her decision for vaccination.  Discussed where to get the vaccine if she decides to take it.  Today's discussion was helpful for her but she is not certain if she will get the vaccine.   Advised that she is now past first trimester and her baby is fully formed.  Preterm labor symptoms and general obstetric precautions including but not limited to vaginal bleeding, contractions, leaking of fluid and fetal movement were reviewed in detail with the patient. Please refer to After Visit Summary for other counseling recommendations.  Return in about 4 weeks (around 07/09/2020) for in person ROB for AFP.  Nolene Bernheim, RN, MSN, NP-BC Nurse Practitioner, [facility] [employer], [facility] 06/11/2020 9:43 AM

## 2020-06-11 NOTE — Patient Instructions (Addendum)
[email or web address] for classes  Constipation, Adult Constipation is when a person:  Poops (has a bowel movement) fewer times in a week than normal.  Has a hard time pooping.  Has poop that is dry, hard, or bigger than normal. Follow these instructions at home: Eating and drinking   Eat foods that have a lot of fiber, such as: ? Fresh fruits and vegetables. ? Whole grains. ? Beans.  Eat less of foods that are high in fat, low in fiber, or overly processed, such as: ? [place] fries. ? Hamburgers. ? Cookies. ? Candy. ? Soda.  Drink enough fluid to keep your pee (urine) clear or pale yellow. General instructions  Exercise regularly or as told by your doctor.  Go to the restroom when you feel like you need to poop. Do not hold it in.  Take over-the-counter and prescription medicines only as told by your doctor. These include any fiber supplements.  Do pelvic floor retraining exercises, such as: ? Doing deep breathing while relaxing your lower belly (abdomen). ? Relaxing your pelvic floor while pooping.  Watch your condition for any changes.  Keep all follow-up visits as told by your doctor. This is important. Contact a doctor if:  You have pain that gets worse.  You have a fever.  You have not pooped for 4 days.  You throw up (vomit).  You are not hungry.  You lose weight.  You are bleeding from the anus.  You have thin, pencil-like poop (stool). Get help right away if:  You have a fever, and your symptoms suddenly get worse.  You leak poop or have blood in your poop.  Your belly feels hard or bigger than normal (is bloated).  You have very bad belly pain.  You feel dizzy or you faint. This information is not intended to replace advice given to you by your health care provider. Make sure you discuss any questions you have with your health care provider. Document Revised: 08/06/2017 Document Reviewed: 02/12/2016 Elsevier Patient Education  2020  Elsevier Inc.  High-Fiber Diet Fiber, also called dietary fiber, is a type of carbohydrate that is found in fruits, vegetables, whole grains, and beans. A high-fiber diet can have many health benefits. Your health care provider may recommend a high-fiber diet to help:  Prevent constipation. Fiber can make your bowel movements more regular.  Lower your cholesterol.  Relieve the following conditions: ? Swelling of veins in the anus (hemorrhoids). ? Swelling and irritation (inflammation) of specific areas of the digestive tract (uncomplicated diverticulosis). ? A problem of the large intestine (colon) that sometimes causes pain and diarrhea (irritable bowel syndrome, IBS).  Prevent overeating as part of a weight-loss plan.  Prevent heart disease, type 2 diabetes, and certain cancers. What is my plan? The recommended daily fiber intake in grams (g) includes:  38 g for men age 37 or younger.  30 g for men over age 42.  25 g for women age 75 or younger.  21 g for women over age 62. You can get the recommended daily intake of dietary fiber by:  Eating a variety of fruits, vegetables, grains, and beans.  Taking a fiber supplement, if it is not possible to get enough fiber through your diet. What do I need to know about a high-fiber diet?  It is better to get fiber through food sources rather than from fiber supplements. There is not a lot of research about how effective supplements are.  Always check the fiber content on  the nutrition facts label of any prepackaged food. Look for foods that contain 5 g of fiber or more per serving.  Talk with a diet and nutrition specialist (dietitian) if you have questions about specific foods that are recommended or not recommended for your medical condition, especially if those foods are not listed below.  Gradually increase how much fiber you consume. If you increase your intake of dietary fiber too quickly, you may have bloating, cramping, or  gas.  Drink plenty of water. Water helps you to digest fiber. What are tips for following this plan?  Eat a wide variety of high-fiber foods.  Make sure that half of the grains that you eat each day are whole grains.  Eat breads and cereals that are made with whole-grain flour instead of refined flour or white flour.  Eat brown rice, bulgur wheat, or millet instead of white rice.  Start the day with a breakfast that is high in fiber, such as a cereal that contains 5 g of fiber or more per serving.  Use beans in place of meat in soups, salads, and pasta dishes.  Eat high-fiber snacks, such as berries, raw vegetables, nuts, and popcorn.  Choose whole fruits and vegetables instead of processed forms like juice or sauce. What foods can I eat?  Fruits Berries. Pears. Apples. Oranges. Avocado. Prunes and raisins. Dried figs. Vegetables Sweet potatoes. Spinach. Kale. Artichokes. Cabbage. Broccoli. Cauliflower. Green peas. Carrots. Squash. Grains Whole-grain breads. Multigrain cereal. Oats and oatmeal. Brown rice. Barley. Bulgur wheat. Millet. Quinoa. Bran muffins. Popcorn. Rye wafer crackers. Meats and other proteins Navy, kidney, and pinto beans. Soybeans. Split peas. Lentils. Nuts and seeds. Dairy Fiber-fortified yogurt. Beverages Fiber-fortified soy milk. Fiber-fortified orange juice. Other foods Fiber bars. The items listed above may not be a complete list of recommended foods and beverages. Contact a dietitian for more options. What foods are not recommended? Fruits Fruit juice. Cooked, strained fruit. Vegetables Fried potatoes. Canned vegetables. Well-cooked vegetables. Grains White bread. Pasta made with refined flour. White rice. Meats and other proteins Fatty cuts of meat. Fried chicken or fried fish. Dairy Milk. Yogurt. Cream cheese. Sour cream. Fats and oils Butters. Beverages Soft drinks. Other foods Cakes and pastries. The items listed above may not be a  complete list of foods and beverages to avoid. Contact a dietitian for more information. Summary  Fiber is a type of carbohydrate. It is found in fruits, vegetables, whole grains, and beans.  There are many health benefits of eating a high-fiber diet, such as preventing constipation, lowering blood cholesterol, helping with weight loss, and reducing your risk of heart disease, diabetes, and certain cancers.  Gradually increase your intake of fiber. Increasing too fast can result in cramping, bloating, and gas. Drink plenty of water while you increase your fiber.  The best sources of fiber include whole fruits and vegetables, whole grains, nuts, seeds, and beans. This information is not intended to replace advice given to you by your health care provider. Make sure you discuss any questions you have with your health care provider. Document Revised: 06/28/2017 Document Reviewed: 06/28/2017 Elsevier Patient Education  2020 [employer].  Second Trimester of Pregnancy  The second trimester is from week 14 through week 27 (month 4 through 6). This is often the time in pregnancy that you feel your best. Often times, morning sickness has lessened or quit. You may have more energy, and you may get hungry more often. Your unborn baby is growing rapidly. At the end  of the sixth month, he or she is about 9 inches long and weighs about 1 pounds. You will likely feel the baby move between 18 and 20 weeks of pregnancy. Follow these instructions at home: Medicines  Take over-the-counter and prescription medicines only as told by your doctor. Some medicines are safe and some medicines are not safe during pregnancy.  Take a prenatal vitamin that contains at least 600 micrograms (mcg) of folic acid.  If you have trouble pooping (constipation), take medicine that will make your stool soft (stool softener) if your doctor approves. Eating and drinking   Eat regular, healthy meals.  Avoid raw meat and  uncooked cheese.  If you get low calcium from the food you eat, talk to your doctor about taking a daily calcium supplement.  Avoid foods that are high in fat and sugars, such as fried and sweet foods.  If you feel sick to your stomach (nauseous) or throw up (vomit): ? Eat 4 or 5 small meals a day instead of 3 large meals. ? Try eating a few soda crackers. ? Drink liquids between meals instead of during meals.  To prevent constipation: ? Eat foods that are high in fiber, like fresh fruits and vegetables, whole grains, and beans. ? Drink enough fluids to keep your pee (urine) clear or pale yellow. Activity  Exercise only as told by your doctor. Stop exercising if you start to have cramps.  Do not exercise if it is too hot, too humid, or if you are in a place of great height (high altitude).  Avoid heavy lifting.  Wear low-heeled shoes. Sit and stand up straight.  You can continue to have sex unless your doctor tells you not to. Relieving pain and discomfort  Wear a good support bra if your breasts are tender.  Take warm water baths (sitz baths) to soothe pain or discomfort caused by hemorrhoids. Use hemorrhoid cream if your doctor approves.  Rest with your legs raised if you have leg cramps or low back pain.  If you develop puffy, bulging veins (varicose veins) in your legs: ? Wear support hose or compression stockings as told by your doctor. ? Raise (elevate) your feet for 15 minutes, 3-4 times a day. ? Limit salt in your food. Prenatal care  Write down your questions. Take them to your prenatal visits.  Keep all your prenatal visits as told by your doctor. This is important. Safety  Wear your seat belt when driving.  Make a list of emergency phone numbers, including numbers for family, friends, the hospital, and police and fire departments. General instructions  Ask your doctor about the right foods to eat or for help finding a counselor, if you need these  services.  Ask your doctor about local prenatal classes. Begin classes before month 6 of your pregnancy.  Do not use hot tubs, steam rooms, or saunas.  Do not douche or use tampons or scented sanitary pads.  Do not cross your legs for long periods of time.  Visit your dentist if you have not done so. Use a soft toothbrush to brush your teeth. Floss gently.  Avoid all smoking, herbs, and alcohol. Avoid drugs that are not approved by your doctor.  Do not use any products that contain nicotine or tobacco, such as cigarettes and e-cigarettes. If you need help quitting, ask your doctor.  Avoid cat litter boxes and soil used by cats. These carry germs that can cause birth defects in the baby and can cause  a loss of your baby (miscarriage) or stillbirth. Contact a doctor if:  You have mild cramps or pressure in your lower belly.  You have pain when you pee (urinate).  You have bad smelling fluid coming from your vagina.  You continue to feel sick to your stomach (nauseous), throw up (vomit), or have watery poop (diarrhea).  You have a nagging pain in your belly area.  You feel dizzy. Get help right away if:  You have a fever.  You are leaking fluid from your vagina.  You have spotting or bleeding from your vagina.  You have severe belly cramping or pain.  You lose or gain weight rapidly.  You have trouble catching your breath and have chest pain.  You notice sudden or extreme puffiness (swelling) of your face, hands, ankles, feet, or legs.  You have not felt the baby move in over an hour.  You have severe headaches that do not go away when you take medicine.  You have trouble seeing. Summary  The second trimester is from week 14 through week 27 (months 4 through 6). This is often the time in pregnancy that you feel your best.  To take care of yourself and your unborn baby, you will need to eat healthy meals, take medicines only if your doctor tells you to do so, and do  activities that are safe for you and your baby.  Call your doctor if you get sick or if you notice anything unusual about your pregnancy. Also, call your doctor if you need help with the right food to eat, or if you want to know what activities are safe for you. This information is not intended to replace advice given to you by your health care provider. Make sure you discuss any questions you have with your health care provider. Document Revised: 12/16/2018 Document Reviewed: 09/29/2016 Elsevier Patient Education  2020 [employer].   Check out Covid vaccine info here:   [email or web address]  7 Things You Should Know About the COVID-19 Vaccines  Here are seven facts you should know before taking your shot:  1.  No serious side effects were reported in clinical trials. Temporary reactions after receiving the vaccine may include a sore arm, headache, feeling tired and achy for a day or two or, in some cases, fever. In most cases, these reactions are good signs that your body is building protection.   2.  Scientists had a head start. They are built on decades of research on vaccines for similar viruses. A big investment of resources and focus made sure they were created without skipping any steps in development, testing, or clinical trials.  3. You cannot get COVID-19 from the vaccine. The vaccine gives your body instructions to make a protein that safely teaches you to make germ-fighting antibodies to fight the real COVID-19.  4.The vaccine protects against the Delta variant. The Delta variant, which is now predominant in [place], is much more contagious than the original virus. Vaccines continue to be remarkably effective in reducing risk of severe disease, hospitalization, and death, even against the Delta variant.  5. A hundred million people in the [place] have already received their COVID-19 vaccine.  6. It works. And once you're fully vaccinated you're protected. The  vaccines are proven to help prevent COVID-19 and are effective in preventing hospitalization and death.   7. The vaccine does not affect fertility. Vaccination for those who are pregnant or wanting to become pregnant is recommended by the [employer]  College of Obstetricians and Gynecologists (ACOG), the Society for Maternal-Fetal Medicine (SMFM), the American Society for Reproductive Medicine (ASRM), and the Society for Female Reproduction and Urology.   Get my shot [facility]

## 2020-07-09 ENCOUNTER — Encounter: Payer: Self-pay | Admitting: Nurse Practitioner

## 2020-07-15 ENCOUNTER — Other Ambulatory Visit: Payer: Self-pay | Admitting: Certified Nurse Midwife

## 2020-07-15 ENCOUNTER — Ambulatory Visit (INDEPENDENT_AMBULATORY_CARE_PROVIDER_SITE_OTHER): Payer: BC Managed Care – PPO | Admitting: Obstetrics and Gynecology

## 2020-07-15 ENCOUNTER — Other Ambulatory Visit: Payer: Self-pay

## 2020-07-15 ENCOUNTER — Encounter: Payer: Self-pay | Admitting: Obstetrics and Gynecology

## 2020-07-15 ENCOUNTER — Ambulatory Visit: Payer: BC Managed Care – PPO | Attending: Certified Nurse Midwife

## 2020-07-15 VITALS — BP 119/67 | HR 71 | Wt 226.9 lb

## 2020-07-15 DIAGNOSIS — Z3492 Encounter for supervision of normal pregnancy, unspecified, second trimester: Secondary | ICD-10-CM

## 2020-07-15 DIAGNOSIS — Z3A19 19 weeks gestation of pregnancy: Secondary | ICD-10-CM

## 2020-07-15 DIAGNOSIS — O99212 Obesity complicating pregnancy, second trimester: Secondary | ICD-10-CM

## 2020-07-15 DIAGNOSIS — Z3490 Encounter for supervision of normal pregnancy, unspecified, unspecified trimester: Secondary | ICD-10-CM | POA: Diagnosis not present

## 2020-07-15 DIAGNOSIS — E669 Obesity, unspecified: Secondary | ICD-10-CM

## 2020-07-15 DIAGNOSIS — Z363 Encounter for antenatal screening for malformations: Secondary | ICD-10-CM

## 2020-07-15 DIAGNOSIS — Z148 Genetic carrier of other disease: Secondary | ICD-10-CM

## 2020-07-15 NOTE — Patient Instructions (Signed)
Second Trimester of Pregnancy The second trimester is from week 14 through week 27 (months 4 through 6). The second trimester is often a time when you feel your best. Your body has adjusted to being pregnant, and you begin to feel better physically. Usually, morning sickness has lessened or quit completely, you may have more energy, and you may have an increase in appetite. The second trimester is also a time when the fetus is growing rapidly. At the end of the sixth month, the fetus is about 9 inches long and weighs about 1 pounds. You will likely begin to feel the baby move (quickening) between 16 and 20 weeks of pregnancy. Body changes during your second trimester Your body continues to go through many changes during your second trimester. The changes vary from woman to woman.  Your weight will continue to increase. You will notice your lower abdomen bulging out.  You may begin to get stretch marks on your hips, abdomen, and breasts.  You may develop headaches that can be relieved by medicines. The medicines should be approved by your health care provider.  You may urinate more often because the fetus is pressing on your bladder.  You may develop or continue to have heartburn as a result of your pregnancy.  You may develop constipation because certain hormones are causing the muscles that push waste through your intestines to slow down.  You may develop hemorrhoids or swollen, bulging veins (varicose veins).  You may have back pain. This is caused by: ? Weight gain. ? Pregnancy hormones that are relaxing the joints in your pelvis. ? A shift in weight and the muscles that support your balance.  Your breasts will continue to grow and they will continue to become tender.  Your gums may bleed and may be sensitive to brushing and flossing.  Dark spots or blotches (chloasma, mask of pregnancy) may develop on your face. This will likely fade after the baby is born.  A dark line from your  belly button to the pubic area (linea nigra) may appear. This will likely fade after the baby is born.  You may have changes in your hair. These can include thickening of your hair, rapid growth, and changes in texture. Some women also have hair loss during or after pregnancy, or hair that feels dry or thin. Your hair will most likely return to normal after your baby is born. What to expect at prenatal visits During a routine prenatal visit:  You will be weighed to make sure you and the fetus are growing normally.  Your blood pressure will be taken.  Your abdomen will be measured to track your baby's growth.  The fetal heartbeat will be listened to.  Any test results from the previous visit will be discussed. Your health care provider may ask you:  How you are feeling.  If you are feeling the baby move.  If you have had any abnormal symptoms, such as leaking fluid, bleeding, severe headaches, or abdominal cramping.  If you are using any tobacco products, including cigarettes, chewing tobacco, and electronic cigarettes.  If you have any questions. Other tests that may be performed during your second trimester include:  Blood tests that check for: ? Low iron levels (anemia). ? High blood sugar that affects pregnant women (gestational diabetes) between 24 and 28 weeks. ? Rh antibodies. This is to check for a protein on red blood cells (Rh factor).  Urine tests to check for infections, diabetes, or protein in the   urine.  An ultrasound to confirm the proper growth and development of the baby.  An amniocentesis to check for possible genetic problems.  Fetal screens for spina bifida and Down syndrome.  HIV (human immunodeficiency virus) testing. Routine prenatal testing includes screening for HIV, unless you choose not to have this test. Follow these instructions at home: Medicines  Follow your health care provider's instructions regarding medicine use. Specific medicines may be  either safe or unsafe to take during pregnancy.  Take a prenatal vitamin that contains at least 600 micrograms (mcg) of folic acid.  If you develop constipation, try taking a stool softener if your health care provider approves. Eating and drinking   Eat a balanced diet that includes fresh fruits and vegetables, whole grains, good sources of protein such as meat, eggs, or tofu, and low-fat dairy. Your health care provider will help you determine the amount of weight gain that is right for you.  Avoid raw meat and uncooked cheese. These carry germs that can cause birth defects in the baby.  If you have low calcium intake from food, talk to your health care provider about whether you should take a daily calcium supplement.  Limit foods that are high in fat and processed sugars, such as fried and sweet foods.  To prevent constipation: ? Drink enough fluid to keep your urine clear or pale yellow. ? Eat foods that are high in fiber, such as fresh fruits and vegetables, whole grains, and beans. Activity  Exercise only as directed by your health care provider. Most women can continue their usual exercise routine during pregnancy. Try to exercise for 30 minutes at least 5 days a week. Stop exercising if you experience uterine contractions.  Avoid heavy lifting, wear low heel shoes, and practice good posture.  A sexual relationship may be continued unless your health care provider directs you otherwise. Relieving pain and discomfort  Wear a good support bra to prevent discomfort from breast tenderness.  Take warm sitz baths to soothe any pain or discomfort caused by hemorrhoids. Use hemorrhoid cream if your health care provider approves.  Rest with your legs elevated if you have leg cramps or low back pain.  If you develop varicose veins, wear support hose. Elevate your feet for 15 minutes, 3-4 times a day. Limit salt in your diet. Prenatal Care  Write down your questions. Take them to  your prenatal visits.  Keep all your prenatal visits as told by your health care provider. This is important. Safety  Wear your seat belt at all times when driving.  Make a list of emergency phone numbers, including numbers for family, friends, the hospital, and police and fire departments. General instructions  Ask your health care provider for a referral to a local prenatal education class. Begin classes no later than the beginning of month 6 of your pregnancy.  Ask for help if you have counseling or nutritional needs during pregnancy. Your health care provider can offer advice or refer you to specialists for help with various needs.  Do not use hot tubs, steam rooms, or saunas.  Do not douche or use tampons or scented sanitary pads.  Do not cross your legs for long periods of time.  Avoid cat litter boxes and soil used by cats. These carry germs that can cause birth defects in the baby and possibly loss of the fetus by miscarriage or stillbirth.  Avoid all smoking, herbs, alcohol, and unprescribed drugs. Chemicals in these products can affect the formation   and growth of the baby.  Do not use any products that contain nicotine or tobacco, such as cigarettes and e-cigarettes. If you need help quitting, ask your health care provider.  Visit your dentist if you have not gone yet during your pregnancy. Use a soft toothbrush to brush your teeth and be gentle when you floss. Contact a health care provider if:  You have dizziness.  You have mild pelvic cramps, pelvic pressure, or nagging pain in the abdominal area.  You have persistent nausea, vomiting, or diarrhea.  You have a bad smelling vaginal discharge.  You have pain when you urinate. Get help right away if:  You have a fever.  You are leaking fluid from your vagina.  You have spotting or bleeding from your vagina.  You have severe abdominal cramping or pain.  You have rapid weight gain or weight loss.  You have  shortness of breath with chest pain.  You notice sudden or extreme swelling of your face, hands, ankles, feet, or legs.  You have not felt your baby move in over an hour.  You have severe headaches that do not go away when you take medicine.  You have vision changes. Summary  The second trimester is from week 14 through week 27 (months 4 through 6). It is also a time when the fetus is growing rapidly.  Your body goes through many changes during pregnancy. The changes vary from woman to woman.  Avoid all smoking, herbs, alcohol, and unprescribed drugs. These chemicals affect the formation and growth your baby.  Do not use any tobacco products, such as cigarettes, chewing tobacco, and e-cigarettes. If you need help quitting, ask your health care provider.  Contact your health care provider if you have any questions. Keep all prenatal visits as told by your health care provider. This is important. This information is not intended to replace advice given to you by your health care provider. Make sure you discuss any questions you have with your health care provider. Document Revised: 12/16/2018 Document Reviewed: 09/29/2016 Elsevier Patient Education  2020 [employer].  Considering [place]? Guide for patients at [facility] [employer] [facility] Why consider waterbirth? . Gentle birth for babies  . Less pain medicine used in labor  . May allow for passive descent/less pushing  . May reduce perineal tears  . More mobility and instinctive maternal position changes  . Increased maternal relaxation  . Reduced blood pressure in labor   Is waterbirth safe? What are the risks of infection, drowning or other complications? . Infection:  Marland Kitchen Very low risk (3.7 % for tub vs 4.8% for bed)  . 7 in 8000 waterbirths with documented infection  . Poorly cleaned equipment most common cause  . Slightly lower group B strep transmission rate  . Drowning  . Maternal:  . Very low risk  . Related to  seizures or fainting  . Newborn:  Marland Kitchen Very low risk. No evidence of increased risk of respiratory problems in multiple large studies  . Physiological protection from breathing under water  . Avoid underwater birth if there are any fetal complications  . Once baby's head is out of the water, keep it out.  . Birth complication  . Some reports of cord trauma, but risk decreased by bringing baby to surface gradually  . No evidence of increased risk of shoulder dystocia. Mothers can usually change positions faster in water than in a bed, possibly aiding the maneuvers to free the shoulder.  ? You  must attend a Waterbirth class at [employer] at [facility] . 3rd Wednesday of every month from 7-9pm  . Free  . Register by calling [phone] or online at [email or web address]  . Bring [place] the certificate from the class to your prenatal appointment  Meet with a midwife at 36 weeks to see if you can still plan a waterbirth and to sign the consent.   If you plan a waterbirth at [facility] and [facility] at [facility], the following purchases are optional: . [employer] . Bathing suit top  . Long-handled mirror  .  Things that would prevent you from having a waterbirth: . Unknown or Positive COVID-19 diagnosis upon admission to hospital  . Premature, <37wks  . Previous cesarean birth  . Presence of thick meconium-stained fluid  . Multiple gestation (Twins, triplets, etc.)  . Uncontrolled diabetes or gestational diabetes requiring medication  . Hypertension diagnosed in pregnancy or preexisting hypertension (gestational hypertension, preeclampsia, or chronic hypertension) . Heavy vaginal bleeding  . Non-reassuring fetal heart rate  . Active infection (MRSA, etc.). Group B Strep is NOT a contraindication for waterbirth.  . If your labor has to be induced and induction method requires continuous monitoring of the baby's heart rate  . Other risks/issues identified by your  obstetrical provider  .  Please remember that birth is unpredictable. Under certain unforeseeable circumstances your provider may advise against giving birth in the tub. These decisions will be made on a case-by-case basis and with the safety of you and your baby as our highest priority.  **Please remember that in order to have a waterbirth, you must test Negative to COVID-19 upon admission to the hospital.**

## 2020-07-15 NOTE — Progress Notes (Signed)
LOW-RISK PREGNANCY OFFICE VISIT Patient name: Beth Newton MRN [id number]  Date of birth: [birth year] Chief Complaint:   Routine Prenatal Visit  History of Present Illness:   Beth Newton is a 24 y.o. 551P0000 female at 915w0d with an Estimated Date of Delivery: 12/09/20 being seen today for ongoing management of a low-risk pregnancy.  Today she reports no complaints. Planning a waterbirth, but did not receive the information to get registered. Contractions: Not present. Vag. Bleeding: None.  Movement: Present. denies leaking of fluid. Review of Systems:   Pertinent items are noted in HPI Denies abnormal vaginal discharge w/ itching/odor/irritation, headaches, visual changes, shortness of breath, chest pain, abdominal pain, severe nausea/vomiting, or problems with urination or bowel movements unless otherwise stated above. Pertinent History Reviewed:  Reviewed past medical,surgical, social, obstetrical and family history.  Reviewed problem list, medications and allergies. Physical Assessment:   Vitals:   07/15/20 0921  BP: 119/67  Pulse: 71  Weight: 226 lb 14.4 oz (102.9 kg)  Body mass index is 35.54 kg/m.        Physical Examination:   General appearance: Well appearing, and in no distress  Mental status: Alert, oriented to person, place, and time  Skin: Warm & dry  Cardiovascular: Normal heart rate noted  Respiratory: Normal respiratory effort, no distress  Abdomen: Soft, gravid, nontender  Pelvic: Cervical exam deferred         Extremities: Edema: None  Fetal Status: Fetal Heart Rate (bpm): 145 Fundal Height: 20 cm Movement: Present    US MFM OB DETAIL +14 WK (Accession [id number]) (Order [id number])  Narrative & Impression  ----------------------------------------------------------------------  OBSTETRICS REPORT                       (Signed Final 07/15/2020 09:10 am) ---------------------------------------------------------------------- Patient Info  ID #:       [id number]                           D.O.B.:  22-Apr-1996 (24 yrs)  Name:       Beth Newton                    Visit Date: 07/15/2020 08:40 am ---------------------------------------------------------------------- Performed By  Attending:        Noralee Spaceavi Shankar MD        Ref. Address:     [address]                                                             [place], [place]                                                             [id number]  Performed By:     Eden Lathearrie Stalter BS      [facility]  Referred By:      [facility] ---------------------------------------------------------------------- Orders  #  Description                           Code        Ordered By  1  [place] MFM OB DETAIL +14 WK               76811.01    [facility] ----------------------------------------------------------------------  #  Order #                     Accession #                Episode #  1  [id number]                   [id number]                 [id number] ---------------------------------------------------------------------- Indications  Antenatal screening for malformations (low     Z36.3  risk NIPS)  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 Cord Insertion:      Visualized  Amniotic Fluid  AFI FV:      Within normal limits                               Largest Pocket(cm)                              7.4 ---------------------------------------------------------------------- Biometry  BPD:      44.9  mm     G. Age:  19w 4d         75  %    CI:        74.75   %    70 - 86  FL/HC:      19.0   %    16.1 - 18.3  HC:      164.8  mm     G. Age:  19w 1d         53  %    HC/AC:      1.19        1.09 - 1.39  AC:      138.2  mm     G. Age:  19w 2d         54  %    FL/BPD:     69.7   %  FL:       31.3  mm     G. Age:  19w 5d         70  %    FL/AC:      22.6   %    20 - 24  HUM:      31.5  mm     G. Age:  20w 4d         89  %  CER:      20.5  mm     G. Age:  19w 5d         67  %  NFT:       3.5  mm  LV:        5.7  mm  CM:        3.8  mm  Est. FW:     293  gm    0 lb 10 oz      72  % ---------------------------------------------------------------------- OB History  Gravidity:    1         Term:   0        Prem:   0        SAB:   0  TOP:          0       Ectopic:  0        Living: 0 ---------------------------------------------------------------------- Gestational Age  LMP:           19w 0d        Date:  03/04/20                 EDD:   12/09/20  U/S Today:     19w 3d                                        EDD:   12/06/20  Best:          19w 0d     Det. By:  LMP  (03/04/20)          EDD:   12/09/20 ---------------------------------------------------------------------- Anatomy  Cranium:               Appears normal         LVOT:                   Appears normal  Cavum:                 Appears normal         Aortic Arch:            Appears normal  Ventricles:            Appears normal  Ductal Arch:            Appears normal  Choroid Plexus:        Appears normal         Diaphragm:              Appears normal  Cerebellum:            Appears normal         Stomach:                Appears normal, left                                                                        sided  Posterior  Fossa:       Appears normal         Abdomen:                Appears normal  Nuchal Fold:           Appears normal         Abdominal Wall:         Appears nml (cord                                                                        insert, abd wall)  Face:                  Appears normal         Cord Vessels:           Appears normal (3                         (orbits and profile)                           vessel cord)  Lips:                  Appears normal         Kidneys:                Appear normal  Palate:                Appears normal         Bladder:                Appears normal  Thoracic:              Appears normal         Spine:                  Appears normal  Heart:                 Appears normal         Upper Extremities:      Appears normal                         (  4CH, axis, and                         situs)  RVOT:                  Appears normal         Lower Extremities:      Appears normal  Other:  Heels/feet and open hands/5th digits visualized. Fetus appears to be          a female. Nasal bone visualized. VC, 3VV and 3VTV visualized. ---------------------------------------------------------------------- Cervix Uterus Adnexa  Cervix  Length:           3.45  cm.  Normal appearance by transabdominal scan.  Uterus  No abnormality visualized.  Right Ovary  Within normal limits.  Left Ovary  Within normal limits.  Cul De Sac  No free fluid seen.  Adnexa  No abnormality visualized. ---------------------------------------------------------------------- Impression  G1 P0. Patient is here for fetal anatomy scan.  We performed fetal anatomy scan. No makers of  aneuploidies or fetal structural defects are seen. Fetal  biometry is consistent with her previously-established dates.  Amniotic fluid is normal and good fetal activity is seen.  Patient understands the limitations of ultrasound in detecting  fetal anomalies.  On cell-free fetal DNA screening, the risks of  fetal  aneuploidies are not increased .  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 I  discussed genetics of carrier status and encouraged partner  screening and genetic counseling. The couple will discuss  and communicate their decision. ---------------------------------------------------------------------- Recommendations  Follow-up scans as clinically indicated. ----------------------------------------------------------------------                  Noralee Space, MD Electronically Signed Final Report   07/15/2020 09:10 am ----------------------------------------------------------------------    Assessment & Plan:  1) Low-risk pregnancy G1P0000 at [redacted]w[redacted]d with an Estimated Date of Delivery: 12/09/20   2) Encounter for supervision of low-risk pregnancy in second trimester - Reviewed normal anatomy U/S with patient - Discussed waterbirth information and expectations - Information provided on waterbirth and second trimester pregnancy   3) [redacted] weeks gestation of pregnancy  Meds: No orders of the defined types were placed in this encounter.  Labs/procedures today: anatomy U/S (see report above)  Plan:  Continue routine obstetrical care   Reviewed: Preterm labor symptoms and general obstetric precautions including but not limited to vaginal bleeding, contractions, leaking of fluid and fetal movement were reviewed in detail with the patient.  All questions were answered. Has home bp cuff. Check bp weekly, let us know if >140/90.   Follow-up: Return in about 4 weeks (around 08/12/2020) for Return OB - My Chart video.  No orders of the defined types were placed in this encounter.  Raelyn Mora MSN, CNM 07/15/2020 9:29 AM

## 2020-08-13 ENCOUNTER — Telehealth: Payer: BC Managed Care – PPO | Admitting: Certified Nurse Midwife

## 2020-08-13 ENCOUNTER — Telehealth (INDEPENDENT_AMBULATORY_CARE_PROVIDER_SITE_OTHER): Payer: BC Managed Care – PPO | Admitting: Nurse Practitioner

## 2020-08-13 DIAGNOSIS — Z3A23 23 weeks gestation of pregnancy: Secondary | ICD-10-CM

## 2020-08-13 DIAGNOSIS — Z148 Genetic carrier of other disease: Secondary | ICD-10-CM

## 2020-08-13 DIAGNOSIS — Z3492 Encounter for supervision of normal pregnancy, unspecified, second trimester: Secondary | ICD-10-CM

## 2020-08-13 NOTE — Progress Notes (Signed)
Pt is currently at work & not able to take BP, states when she gets home will record in [place].

## 2020-08-13 NOTE — Progress Notes (Signed)
OBSTETRICS PRENATAL VIRTUAL VISIT ENCOUNTER NOTE  [facility]   I connected with [name] on 08/13/20 at  2:35 PM EST by MyChart Video Encounter at home and verified that I am speaking with the correct person using two identifiers.   I discussed the limitations, risks, security and privacy concerns of performing an evaluation and management service virtually and the availability of in person appointments. I also discussed with the patient that there may be a patient responsible charge related to this service. The patient expressed understanding and agreed to proceed. Subjective:  Beth Newton is a 24 y.o. G1P0000 at 2349w1d being seen today for ongoing prenatal care.  She is currently monitored for the following issues for this low-risk pregnancy and has Supervision of low-risk pregnancy and Other constipation on their problem list.  Patient reports no complaints.  Contractions: Not present. Vag. Bleeding: None.  Movement: Present. Denies any leaking of fluid.   The following portions of the patient's history were reviewed and updated as appropriate: allergies, current medications, past family history, past medical history, past social history, past surgical history and problem list.   Objective:  There were no vitals filed for this visit.  Fetal Status:     Movement: Present     General:  Alert, oriented and cooperative. Patient is in no acute distress.  Respiratory: Normal respiratory effort, no problems with respiration noted  Mental Status: Normal mood and affect. Normal behavior. Normal judgment and thought content.  Rest of physical exam deferred due to type of encounter  Imaging: US MFM OB DETAIL +14 WK  Result Date: 07/15/2020 ----------------------------------------------------------------------  OBSTETRICS REPORT                       (Signed Final 07/15/2020 09:10 am)  ---------------------------------------------------------------------- Patient Info  ID #:       [id number]                          D.O.B.:  1996-01-30 (24 yrs)  Name:       Beth Newton                    Visit Date: 07/15/2020 08:40 am ---------------------------------------------------------------------- Performed By  Attending:        Noralee Spaceavi Shankar MD        Ref. Address:     [address]                                                             [place], [place]                                                             [id number]  Performed By:     Eden Lathearrie Stalter BS      [facility]  Referred By:      [facility] ---------------------------------------------------------------------- Orders  #  Description                           Code        Ordered By  1  [place] MFM OB DETAIL +14 WK               76811.01    [facility] ----------------------------------------------------------------------  #  Order #                     Accession #                Episode #  1  [id number]                   [id number]                 [id number] ---------------------------------------------------------------------- Indications  Antenatal screening for malformations (low     Z36.3  risk NIPS)  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 Cord Insertion:      Visualized   Amniotic Fluid  AFI FV:      Within normal limits                              Largest Pocket(cm)                              7.4 ---------------------------------------------------------------------- Biometry  BPD:      44.9  mm     G. Age:  19w 4d         75  %    CI:        74.75   %    70 - 86  FL/HC:      19.0   %    16.1 - 18.3  HC:      164.8  mm     G. Age:  19w 1d         53  %    HC/AC:      1.19        1.09 - 1.39  AC:      138.2  mm     G. Age:  19w 2d         54  %    FL/BPD:     69.7   %  FL:       31.3  mm     G. Age:  19w 5d         70  %    FL/AC:      22.6   %    20 - 24  HUM:      31.5  mm     G. Age:  20w 4d         89  %  CER:      20.5  mm     G. Age:  19w 5d         67  %  NFT:       3.5  mm  LV:        5.7  mm  CM:        3.8  mm  Est. FW:     293  gm    0 lb 10 oz      72  % ---------------------------------------------------------------------- OB History  Gravidity:    1         Term:   0        Prem:   0        SAB:   0  TOP:          0       Ectopic:  0        Living: 0 ---------------------------------------------------------------------- Gestational Age  LMP:           19w 0d        Date:  03/04/20                 EDD:   12/09/20  U/S Today:     19w 3d                                        EDD:   12/06/20  Best:          19w 0d     Det. By:  LMP  (03/04/20)          EDD:   12/09/20 ---------------------------------------------------------------------- Anatomy  Cranium:               Appears normal         LVOT:                   Appears normal  Cavum:                 Appears normal         Aortic Arch:            Appears normal  Ventricles:            Appears normal  Ductal Arch:            Appears normal  Choroid Plexus:        Appears normal         Diaphragm:              Appears normal  Cerebellum:            Appears normal         Stomach:                Appears normal, left                                                                         sided  Posterior Fossa:       Appears normal         Abdomen:                Appears normal  Nuchal Fold:           Appears normal         Abdominal Wall:         Appears nml (cord                                                                        insert, abd wall)  Face:                  Appears normal         Cord Vessels:           Appears normal (3                         (orbits and profile)                           vessel cord)  Lips:                  Appears normal         Kidneys:                Appear normal  Palate:                Appears normal         Bladder:                Appears normal  Thoracic:              Appears normal         Spine:                  Appears normal  Heart:                 Appears normal         Upper Extremities:      Appears normal                         (  4CH, axis, and                         situs)  RVOT:                  Appears normal         Lower Extremities:      Appears normal  Other:  Heels/feet and open hands/5th digits visualized. Fetus appears to be          a female. Nasal bone visualized. VC, 3VV and 3VTV visualized. ---------------------------------------------------------------------- Cervix Uterus Adnexa  Cervix  Length:           3.45  cm.  Normal appearance by transabdominal scan.  Uterus  No abnormality visualized.  Right Ovary  Within normal limits.  Left Ovary  Within normal limits.  Cul De Sac  No free fluid seen.  Adnexa  No abnormality visualized. ---------------------------------------------------------------------- Impression  G1 P0. Patient is here for fetal anatomy scan.  We performed fetal anatomy scan. No makers of  aneuploidies or fetal structural defects are seen. Fetal  biometry is consistent with her previously-established dates.  Amniotic fluid is normal and good fetal activity is seen.  Patient understands the limitations of ultrasound in detecting  fetal anomalies.  On cell-free fetal DNA screening, the risks  of fetal  aneuploidies are not increased .  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 I  discussed genetics of carrier status and encouraged partner  screening and genetic counseling. The couple will discuss  and communicate their decision. ---------------------------------------------------------------------- Recommendations  Follow-up scans as clinically indicated. ----------------------------------------------------------------------                  Noralee Space, MD Electronically Signed Final Report   07/15/2020 09:10 am ----------------------------------------------------------------------   Assessment and Plan:  Pregnancy: G1P0000 at [redacted]w[redacted]d 1. Encounter for supervision of low-risk pregnancy in second trimester Doing well Attended waterbirth class.  Will bring certificate to next visit.  Advised she will see a midwife and sign waterbirth consent Advised to sign up for childbirth and breastfeeding classes. Plans to get an exercise ball and use for lower back stretching. Has not had Covid vaccine.  Recommended vaccine as safe in pregnancy.  Info placed in AVS for her. Reviewed in person and fasting visit in 4 weeks for diabetes testing.  2. Genetic carrier status Discussed with client.  Advised she needs partner testing done.  Plans to call [facility] for details about partner testing.   Preterm labor symptoms and general obstetric precautions including but not limited to vaginal bleeding, contractions, leaking of fluid and fetal movement were reviewed in detail with the patient. I discussed the assessment and treatment plan with the patient. The patient was provided an opportunity to ask questions and all were answered. The patient agreed with the plan and demonstrated an understanding of the instructions. The patient was advised to call back or seek an in-person office evaluation/go to [facility] at [facility] for any urgent or concerning symptoms. Please refer to  After Visit Summary for other counseling recommendations.   I provided 9 minutes of face-to-face time during this encounter.  Return in about 4 weeks (around 09/10/2020) for early AM appt for fasting glucose, Midwife only appt.  No future appointments. scheduled at the time chart was signed.  Currie Paris, NP [facility] [employer], [facility]

## 2020-08-13 NOTE — Patient Instructions (Signed)
[email or web address] - childbirth and breastfeeding classes  Check out Covid vaccine info here:   [email or web address]  7 Things You Should Know About the COVID-19 Vaccines  Here are seven facts you should know before taking your shot:  1.  No serious side effects were reported in clinical trials. Temporary reactions after receiving the vaccine may include a sore arm, headache, feeling tired and achy for a day or two or, in some cases, fever. In most cases, these reactions are good signs that your body is building protection.   2.  Scientists had a head start. They are built on decades of research on vaccines for similar viruses. A big investment of resources and focus made sure they were created without skipping any steps in development, testing, or clinical trials.  3. You cannot get COVID-19 from the vaccine. The vaccine gives your body instructions to make a protein that safely teaches you to make germ-fighting antibodies to fight the real COVID-19.  4.The vaccine protects against the Delta variant. The Delta variant, which is now predominant in [place], is much more contagious than the original virus. Vaccines continue to be remarkably effective in reducing risk of severe disease, hospitalization, and death, even against the Delta variant.  5. A hundred million people in the [place] have already received their COVID-19 vaccine.  6. It works. And once you're fully vaccinated you're protected. The vaccines are proven to help prevent COVID-19 and are effective in preventing hospitalization and death.   7. The vaccine does not affect fertility. Vaccination for those who are pregnant or wanting to become pregnant is recommended by the [employer] of Obstetricians and Gynecologists (ACOG), the Society for Maternal-Fetal Medicine (SMFM), the American Society for Reproductive Medicine (ASRM), and the Society for Female Reproduction and Urology.

## 2020-09-07 NOTE — L&D Delivery Note (Signed)
Delivery Note At 6:36 PM a viable and healthy female was delivered via Vaginal, Spontaneous (Presentation: Left Occiput Anterior).  APGAR: 9, 9; weight 7 lb 14.1 oz (3575 g).   Placenta status: Spontaneous, Intact.  Cord: 3 vessels with the following complications: None.    Anesthesia: Local Episiotomy: None Lacerations: 2nd degree Suture Repair: 3.0 vicryl Est. Blood Loss (mL): 400  Mom to postpartum.  Baby to Couplet care / Skin to Skin.  Beth Newton is a 25 y.o. female G1P1001 with IUP at [redacted]w[redacted]d admitted for postterm IOL with single elevated BP in office.  She progressed with misoprostol, Pitocin, Foley bulb, and AROM for augmentation to complete and pushed less than 30 minutes to deliver.  Cord clamping delayed by 1-3 minutes then clamped by CNM and cut by FOB.  Placenta intact and spontaneous, bleeding minimal.  Second degree perineal laceration repaired without difficulty.  Mom and baby stable prior to transfer to postpartum. She plans on breastfeeding.    Sharen Counter 12/12/2020, 8:42 PM

## 2020-09-09 ENCOUNTER — Other Ambulatory Visit: Payer: Self-pay

## 2020-09-09 DIAGNOSIS — Z3492 Encounter for supervision of normal pregnancy, unspecified, second trimester: Secondary | ICD-10-CM

## 2020-09-11 ENCOUNTER — Other Ambulatory Visit: Payer: BC Managed Care – PPO

## 2020-09-11 ENCOUNTER — Other Ambulatory Visit: Payer: Self-pay

## 2020-09-11 ENCOUNTER — Ambulatory Visit (INDEPENDENT_AMBULATORY_CARE_PROVIDER_SITE_OTHER): Payer: BC Managed Care – PPO | Admitting: Certified Nurse Midwife

## 2020-09-11 VITALS — BP 130/84 | HR 106

## 2020-09-11 DIAGNOSIS — Z3A27 27 weeks gestation of pregnancy: Secondary | ICD-10-CM

## 2020-09-11 DIAGNOSIS — Z3492 Encounter for supervision of normal pregnancy, unspecified, second trimester: Secondary | ICD-10-CM

## 2020-09-11 MED ORDER — COMFORT FIT MATERNITY SUPP LG MISC: 1.0000 [IU] | Freq: Every day | 0 refills | Status: DC

## 2020-09-11 MED ORDER — BREAST PUMP MISC: 0 refills | Status: DC

## 2020-09-11 NOTE — Patient Instructions (Addendum)
PREGNANCY SUPPORT BELT: You are not alone, Seventy-five percent of women have some sort of abdominal or back pain at some point in their pregnancy. Your baby is growing at a fast pace, which means that your whole body is rapidly trying to adjust to the changes. As your uterus grows, your back may start feeling a bit under stress and this can result in back or abdominal pain that can go from mild, and therefore bearable, to severe pains that will not allow you to sit or lay down comfortably, When it comes to dealing with pregnancy-related pains and cramps, some pregnant women usually prefer natural remedies, which the market is filled with nowadays. For example, wearing a pregnancy support belt can help ease and lessen your discomfort and pain. WHAT ARE THE BENEFITS OF WEARING A PREGNANCY SUPPORT BELT? A pregnancy support belt provides support to the lower portion of the belly taking some of the weight of the growing uterus and distributing to the other parts of your body. It is designed make you comfortable and gives you extra support. Over the years, the pregnancy apparel market has been studying the needs and wants of pregnant women and they have come up with the most comfortable pregnancy support belts that woman could ever ask for. In fact, you will no longer have to wear a stretched-out or bulky pregnancy belt that is visible underneath your clothes and makes you feel even more uncomfortable. Nowadays, a pregnancy support belt is made of comfortable and stretchy materials that will not irritate your skin but will actually make you feel at ease and you will not even notice you are wearing it. They are easy to put on and adjust during the day and can be worn at night for additional support.  BENEFITS: . Relives Back pain . Relieves Abdominal Muscle and Leg Pain . Stabilizes the Pelvic Ring . Offers a Cushioned Abdominal Lift Pad . Relieves pressure on the Sciatic Nerve Within Minutes WHERE TO GET  YOUR PREGNANCY BELT: [employer] [phone] @2301  [address] [place], [place]     AREA PEDIATRIC/FAMILY PRACTICE PHYSICIANS  Central/Southeast [place] [phone]) . [facility], MD; Melodie Bouillon, MD; Lum Babe, MD; Sheffield Slider, MD; McDiarmid, MD; Leveda Anna, MD; Jerene Bears, MD; Jennette Kettle, MD o [address] [place] [place], [place] o [phone] o Mon-Fri 8:30-12:30, 1:30-5:00 o Providers come to see babies at [facility] : [facility] o Accepting Medicaid . [facility] o Limited providers who accept newborns: [place], MD; Docia Chuck, MD; Kateri Plummer, MD o [address], [place], [place] o [phone] o Mon-Fri 8:00-5:30 o Babies seen by providers at [facility] o Does NOT accept Medicaid o Please call early in hospitalization for appointment (limited availability)  . [facility], MD o [address], [place], [place] o [phone] o Mon, Tue, Thur, Fri 8:30-5:00, Wed 10:00-7:00 (closed 1-2pm) o Babies seen by [facility] providers o Accepting Medicaid . [facility] - Pediatrician Donnie Coffin, MD o [address], [place], [place] o [phone] o Mon-Fri 8:30-5:00, Sat 8:30-12:00 o Provider comes to see babies at [facility] o Accepting Medicaid o Must have been referred from current patients or contacted office prior to delivery . [facility]) [facility], MD; Leotis Pain, MD; Ave Filter, MD; Luna Fuse, MD; Kennedy Bucker, MD; Konrad Dolores, MD; Kathlene November, MD; Jenne Campus, MD; Lubertha South, MD; Wynetta Emery, MD; Stryff[place],  NP; Baldo Ash, NP o [place], [place], [place] [address] o [phone] o Mon, Tue, Thur, Fri 8:30-5:30, Wed 9:30-5:30, Sat 8:30-12:30 o Babies seen by [facility] providers o Accepting Medicaid o Only accepting infants of first-time parents or siblings of  current patients [facility] discharge coordinator will make follow-up appointment . Baltazar Najjar o [place] [address], [place], [place]  [address] o [phone]   Fax - [phone] . [facility] o [address], [place], [place]  [address] o Phone - [phone]   Fax [phone] . [place], [place], [place], [place]  [address] o [phone]  [place] [phone]) . [facility] Reginal Lutes, MD; Jacklynn Ganong, MD; Torrie Mayers, MD; MD; Rosana Hoes, MD; Servando Salina, MD; Rose Fillers, MD; Rex Kras, MD; Corinna Capra, MD; Volney American, MD; Trilby Drummer, MD; Janann Colonel, MD; Jimmye Norman, [place], [place], [place] [address] o [phone] o Mon-Fri 8:30-5:00 (extended evenings Mon-Thur as needed), Sat-Sun 10:00-1:00 o Providers come to see babies at [place] Medicaid for families of first-time babies and families with all children in the household age 41 and under. Must register with office prior to making appointment (M-F only). . [place], NP; Tomi Bamberger, MD; Redmond School, MD; [place], [place] [address] [place], [place] [address] o [phone] o Mon-Fri 8:00-5:00 o Babies seen by providers at [facility] o Does NOT accept Medicaid/Commercial Insurance Only . [facility] [place] [facility])  Marnee Guarneri, MD; Drema Dallas, MD; Montine Circle, MD; Vilma Prader, MD; Vanita Panda, MD; Alfonso Ramus, MD; Ruthann Cancer, MD; Roxanne Mins, MD; Rosalva Ferron, MD; Polly Cobia, MD o [address] [place], [place] [address] o [phone] o Mon-Fri 8:30-5:30, Sat (Oct.-Mar.) 9:00-1:00 o Babies seen by providers at [place] [phone]) . [facility], MD; Suzan Slick, MD o [address], [place], [place] [address] o [phone] o Mon-Fri 8:30-5:00, Sat 8:30-12:00 o Providers come to see babies at [facility] o Does NOT accept Medicaid . [place], [place]; [place], MD; [place], [place]; Nancy Fetter, MD; Moreen Fowler, [place], [place], [place] [address] o [phone] o Mon-Fri 8:00-5:00 o Babies seen by providers at [facility] o Does NOT accept Medicaid o Only accepting babies of parents who are patients o Please call early in hospitalization for appointment (limited availability) . [facility] Blanca Friend, MD; Sharlene Motts, MD; Rod Can, MD; Warner Mccreedy, NP; Sabra Heck, MD; Ermalinda Memos, MD; Sharlett Iles, NP; Aurther Loft, MD; Jerrye Beavers, MD; Marcello Moores, MD; Berline Lopes, MD; Charolette Forward, MD o [place] [place], [place], [place] [address] o [phone] o Mon-Fri 8:00-5:00, Sat 9:00-12:00 o Providers come to see babies at [facility] o Does NOT accept [facility] [phone]) . [facility] [place] providers accepting new patients: Dayna Ramus, NP; [place], [place], [place], [place] [address] o [phone] o Mon-Fri 8:00-5:00 o Babies seen by providers at [facility] o Does NOT accept Medicaid o Only accepting babies of parents who are patients o Please call early in hospitalization for appointment (limited availability) . [facility] Oswaldo Conroy, MD; Sheran Lawless, MD o [address] [place], [place] [address] o [phone] (press 1 to schedule appointment) o Mon-Fri 8:00-5:00 o Providers come to see babies at [facility] o Does NOT accept Medicaid . [facility] Jodi Mourning, MD o [address] [place], [place] [address] o [phone] o  Mon-Fri 8:30-5:00 (lunch 12:30-1:00), extended hours by appointment only Wed 5:00-6:30 o Babies seen by [facility] providers o Accepting Medicaid . [facility] Abbot, MD; [place], MD; Hassan Rowan, MD o [address] [place], [place], [place] [address] o [phone] o Mon-Fri 8:00-5:00 o Babies seen by [facility] providers o Does NOT accept Medicaid . Nature conservation officer at  [place] [address] [place] Soho, MD; Durene Cal, MD; [place], DO o [address] [place], [place] [address] o [phone] o Mon-Fri 8:00-5:00 o Babies seen by [facility] providers o Does NOT accept Medicaid . [facility] o [place], [place]; [place], [place]; Ty Ty, NP; Avis Epley, MD; Vonna Kotyk, MD; Clance Boll, MD; Stevphen Rochester, NP; Arvilla Market, NP; Ann Maki, NP; Otis Dials, NP; Vaughan Basta, MD; [place], MD o [address] [place], [place] [address] o [phone] o Mon-Fri 8:30-5:00, Sat 10:00-1:00 o Providers come to see babies at [facility] o Does NOT accept Medicaid o Free prenatal information session Tuesdays at 4:45pm . [facility] Luna Kitchens, MD; [place], [place]; [place], [place]; [place], [place] o [address] [place] [address] o [phone] o Mon-Fri 7:30-5:30 o Babies seen by [facility] providers . [facility] o [address], [place], [place]  [address] o [phone]   Fax - [phone]  [place] [phone] & [phone]) . [facility] Alphonsa Overall, MD o [address] [place], [place] [address] o [phone] o Mon-Thur 8:00-6:00 o Providers come to see babies at [facility] o Accepting Medicaid . [facility] Zenon Mayo, NP; Cyndia Bent, MD; [place], [place]; [place], [place] o [address] [place], [place] [address] o [phone] o Mon-Thur 7:30-7:30, Fri 7:30-4:30 o Babies seen by [facility] providers o Accepting Medicaid . [facility] Cheryle Horsfall, MD; Janene Harvey, NP; Vonita Moss, MD o [address], [place], [place] [address] o [phone] o Mon-Fri 8:30-5:00, Sat 8:30-12:00 o Providers come to see babies at [facility] o Accepting Medicaid o Must have "Meet & Greet" appointment at office prior to delivery . [facility] Llana Aliment, MD; Earlene Plater, MD; Lucretia Roers, MD o [address], [place], [place]  [address] o [phone] o Mon-Wed 8:00-6:00, Thur-Fri 8:00-5:00, Sat 9:00-12:00 o Providers come to see babies at [facility] o Does NOT accept Medicaid o Only accepting siblings of current patients . [facility]  o [address], [place], [place]  [address] o [phone]   Fax - [phone] . [facility] at [facility] o [phone] [address] [place], [place]  [address] o [phone]   Fax - [phone]  [place] [phone] & [phone]) . Nature conservation officer at [facility] o [place], DO; [place], DO o [address] [place], [place] [address] o [phone] o Mon-Fri 7:00-5:00 o Babies seen by [facility] providers o Does NOT accept Medicaid . [facility] Ellis Savage, MD; [place], [place]; [place], [place] o [address], [place], [place] [address] o [phone] o Mon-Fri 8:00-5:00 o Babies seen by [facility] providers o Accepting Medicaid . [facility] - [address] Franne Forts, MD; [place], [place]; [place], NP; [place], [place] o [address] [place], [place], [place] [address] o [phone] o Mon-Fri 8:00-5:00 o Babies seen by providers at [facility] o Accepting Medicaid

## 2020-09-11 NOTE — Progress Notes (Signed)
   PRENATAL VISIT NOTE  Subjective:  Beth Newton is a 25 y.o. G1P0000 at [redacted]w[redacted]d being seen today for ongoing prenatal care.  She is currently monitored for the following issues for this low-risk pregnancy and has Supervision of low-risk pregnancy and Other constipation on their problem list.  Patient reports no complaints.  Contractions: Not present. Vag. Bleeding: None.  Movement: Present. Denies leaking of fluid.   The following portions of the patient's history were reviewed and updated as appropriate: allergies, current medications, past family history, past medical history, past social history, past surgical history and problem list.   Objective:   Vitals:   09/11/20 0837  BP: 130/84  Pulse: (!) 106    Fetal Status: Fetal Heart Rate (bpm): 146 Fundal Height: 28 cm Movement: Present     General:  Alert, oriented and cooperative. Patient is in no acute distress.  Skin: Skin is warm and dry. No rash noted.   Cardiovascular: Normal heart rate noted  Respiratory: Normal respiratory effort, no problems with respiration noted  Abdomen: Soft, gravid, appropriate for gestational age.  Pain/Pressure: Absent     Pelvic: Cervical exam deferred        Extremities: Normal range of motion.  Edema: None  Mental Status: Normal mood and affect. Normal behavior. Normal judgment and thought content.   Assessment and Plan:  Pregnancy: G1P0000 at [redacted]w[redacted]d 1. Encounter for supervision of low-risk pregnancy in second trimester - Waterbirth counseling and consent performed, certificate and consent to be scanned into chart - Pt on her feet at work, advised her to start daily stretching/movements for optimal fetal positioning, wear her maternity belt and start wearing compression socks while standing.  2. [redacted] weeks gestation of pregnancy - Glucose Tolerance, 2 Hours w/1 Hour - RPR - HIV Antibody (routine testing w rflx) - CBC - Elastic Bandages & Supports (COMFORT FIT MATERNITY SUPP LG) MISC; 1 Units  by Does not apply route daily.  Dispense: 1 each; Refill: 0 - Misc. Devices (BREAST PUMP) MISC; Dispense one breast pump for patient  Dispense: 1 each; Refill: 0  Preterm labor symptoms and general obstetric precautions including but not limited to vaginal bleeding, contractions, leaking of fluid and fetal movement were reviewed in detail with the patient. Please refer to After Visit Summary for other counseling recommendations.   Return in about 2 weeks (around 09/25/2020) for VIRTUAL, LOB.  Future Appointments  Date Time Provider Department Center  09/25/2020  9:55 AM Armando Reichert, CNM [facility]    Bernerd Limbo, CNM

## 2020-09-12 LAB — HIV ANTIBODY (ROUTINE TESTING W REFLEX): HIV Screen 4th Generation wRfx: NONREACTIVE

## 2020-09-12 LAB — GLUCOSE TOLERANCE, 2 HOURS W/ 1HR
Glucose, 1 hour: 97 mg/dL (ref 65–179)
Glucose, 2 hour: 91 mg/dL (ref 65–152)
Glucose, Fasting: 81 mg/dL (ref 65–91)

## 2020-09-12 LAB — CBC
Hematocrit: 34.2 % (ref 34.0–46.6)
Hemoglobin: 11 g/dL — ABNORMAL LOW (ref 11.1–15.9)
MCH: 27.6 pg (ref 26.6–33.0)
MCHC: 32.2 g/dL (ref 31.5–35.7)
MCV: 86 fL (ref 79–97)
Platelets: 231 10*3/uL (ref 150–450)
RBC: 3.99 x10E6/uL (ref 3.77–5.28)
RDW: 12.4 % (ref 11.7–15.4)
WBC: 6.1 10*3/uL (ref 3.4–10.8)

## 2020-09-12 LAB — RPR: RPR Ser Ql: NONREACTIVE

## 2020-09-16 ENCOUNTER — Encounter: Payer: Self-pay | Admitting: *Deleted

## 2020-09-25 ENCOUNTER — Telehealth (INDEPENDENT_AMBULATORY_CARE_PROVIDER_SITE_OTHER): Payer: BC Managed Care – PPO | Admitting: Student

## 2020-09-25 DIAGNOSIS — Z3A29 29 weeks gestation of pregnancy: Secondary | ICD-10-CM

## 2020-09-25 DIAGNOSIS — Z3493 Encounter for supervision of normal pregnancy, unspecified, third trimester: Secondary | ICD-10-CM

## 2020-09-25 DIAGNOSIS — Z3492 Encounter for supervision of normal pregnancy, unspecified, second trimester: Secondary | ICD-10-CM

## 2020-09-25 NOTE — Progress Notes (Signed)
Patient ID: Beth Newton, female   DOB: 05-23-1996, 25 y.o.   MRN: [id number] I connected with Beth Newton 09/25/20 at  9:55 AM EST by: MyChart video and verified that I am speaking with the correct person using two identifiers.  Patient is located at work and provider is located at [facility]    The purpose of this virtual visit is to provide medical care while limiting exposure to the novel coronavirus. I discussed the limitations, risks, security and privacy concerns of performing an evaluation and management service by MyChart video and the availability of in person appointments. I also discussed with the patient that there may be a patient responsible charge related to this service. By engaging in this virtual visit, you consent to the provision of healthcare.  Additionally, you authorize for your insurance to be billed for the services provided during this visit.  The patient expressed understanding and agreed to proceed.  The following staff members participated in the virtual visit:  Beth Newton    PRENATAL VISIT NOTE  Subjective:  Beth Newton is a 25 y.o. G1P0000 at [redacted]w[redacted]d  for phone visit for ongoing prenatal care.  She is currently monitored for the following issues for this low-risk pregnancy and has Supervision of low-risk pregnancy and Other constipation on their problem list.  Patient reports no complaints. Reports a HA on Saturday when she didn't eat all day; she did not take her BP. She has not been taking her BP regularly.  Contractions: Not present. Vag. Bleeding: None.  Movement: Present. Denies leaking of fluid.   The following portions of the patient's history were reviewed and updated as appropriate: allergies, current medications, past family history, past medical history, past social history, past surgical history and problem list.   Objective:  There were no vitals filed for this visit. Self-Obtained  Fetal Status:     Movement: Present     Assessment and Plan:   Pregnancy: G1P0000 at [redacted]w[redacted]d 1. Encounter for supervision of low-risk pregnancy in second trimester -encouraged patient to eat regular meals; check BP if she starts to feel HA. Reviewed warning signs and when to come to [facility]U>  -patient's partner declines genetic testing for now for alpha thal -see CNM at 36 week to discuss wb again -Reviewed importance of taking BP; in order for herr to have a WB we will need to be sure that her BP is normal and we want to be tracking that every week.  -Patient agrees to take BP tonight; she also works at [employer] and can have a colleague take her BP as well.   Preterm labor symptoms and general obstetric precautions including but not limited to vaginal bleeding, contractions, leaking of fluid and fetal movement were reviewed in detail with the patient.  Return in about 3 weeks (around 10/16/2020), or LROB with KK or JW.  No future appointments.   Time spent on virtual visit: 15 minutes  Marylene Land, CNM

## 2020-09-25 NOTE — Progress Notes (Signed)
I connected with  Beth Newton on 09/25/20 at 1002 by MyChart and verified that I am speaking with the correct person using two identifiers.   I discussed the limitations, risks, security and privacy concerns of performing an evaluation and management service by telephone and the availability of in person appointments. I also discussed with the patient that there may be a patient responsible charge related to this service. The patient expressed understanding and agreed to proceed.  Pt has BP cuff at home, but is currently at place of work. Agrees to check BP once at home and to log into [employer] or send message via MyChart. Denies any s/s of htn today. Reports mild headaches, most recently yesterday.   Marjo Bicker, RN 09/25/2020  10:02 AM

## 2020-10-18 ENCOUNTER — Ambulatory Visit (INDEPENDENT_AMBULATORY_CARE_PROVIDER_SITE_OTHER): Payer: BC Managed Care – PPO | Admitting: Student

## 2020-10-18 ENCOUNTER — Other Ambulatory Visit: Payer: Self-pay

## 2020-10-18 DIAGNOSIS — Z3492 Encounter for supervision of normal pregnancy, unspecified, second trimester: Secondary | ICD-10-CM

## 2020-10-18 NOTE — Progress Notes (Signed)
   PRENATAL VISIT NOTE  Subjective:  Beth Newton is a 25 y.o. G1P0000 at [redacted]w[redacted]d being seen today for ongoing prenatal care.  She is currently monitored for the following issues for this low-risk pregnancy and has Supervision of low-risk pregnancy and Other constipation on their problem list.  Patient reports no complaints.  Contractions: Not present. Vag. Bleeding: None.  Movement: Present. Denies leaking of fluid.   The following portions of the patient's history were reviewed and updated as appropriate: allergies, current medications, past family history, past medical history, past social history, past surgical history and problem list.   Objective:   Vitals:   10/18/20 1020  BP: 125/80  Pulse: (!) 102    Fetal Status: Fetal Heart Rate (bpm): 145 Fundal Height: 32 cm Movement: Present     General:  Alert, oriented and cooperative. Patient is in no acute distress.  Skin: Skin is warm and dry. No rash noted.   Cardiovascular: Normal heart rate noted  Respiratory: Normal respiratory effort, no problems with respiration noted  Abdomen: Soft, gravid, appropriate for gestational age.  Pain/Pressure: Absent     Pelvic: Cervical exam deferred        Extremities: Normal range of motion.  Edema: Trace  Mental Status: Normal mood and affect. Normal behavior. Normal judgment and thought content.   Assessment and Plan:  Pregnancy: G1P0000 at [redacted]w[redacted]d 1. Encounter for supervision of low-risk pregnancy in second trimester    -BP is normal today -did not like nexplanon; the pills were "messing" with her periods as well, had heavy periods with BC pills Preterm labor symptoms and general obstetric precautions including but not limited to vaginal bleeding, contractions, leaking of fluid and fetal movement were reviewed in detail with the patient. Please refer to After Visit Summary for other counseling recommendations.   Return in about 4 weeks (around 11/15/2020), or LROB in person with  [place].  No future appointments.  Marylene Land, CNM

## 2020-11-01 ENCOUNTER — Telehealth (INDEPENDENT_AMBULATORY_CARE_PROVIDER_SITE_OTHER): Payer: BC Managed Care – PPO | Admitting: Advanced Practice Midwife

## 2020-11-01 DIAGNOSIS — Z3493 Encounter for supervision of normal pregnancy, unspecified, third trimester: Secondary | ICD-10-CM

## 2020-11-01 DIAGNOSIS — Z3A34 34 weeks gestation of pregnancy: Secondary | ICD-10-CM

## 2020-11-01 NOTE — Progress Notes (Signed)
   OBSTETRICS PRENATAL VIRTUAL VISIT ENCOUNTER NOTE  Provider location: [facility] [place] [facility] [place] [facility]   I connected with Beth Newton on 11/01/20 at  9:15 AM EST by MyChart Video Encounter at home and verified that I am speaking with the correct person using two identifiers.   I discussed the limitations, risks, security and privacy concerns of performing an evaluation and management service virtually and the availability of in person appointments. I also discussed with the patient that there may be a patient responsible charge related to this service. The patient expressed understanding and agreed to proceed. Subjective:  Beth Newton is a 25 y.o. G1P0000 at 29w4dbeing seen today for ongoing prenatal care.  She is currently monitored for the following issues for this low-risk pregnancy and has Supervision of low-risk pregnancy and Other constipation on their problem list.  Patient reports no complaints.  Contractions: Not present. Vag. Bleeding: None.  Movement: Present. Denies any leaking of fluid.   The following portions of the patient's history were reviewed and updated as appropriate: allergies, current medications, past family history, past medical history, past social history, past surgical history and problem list.   Objective:  There were no vitals filed for this visit.  Fetal Status:     Movement: Present     General:  Alert, oriented and cooperative. Patient is in no acute distress.  Respiratory: Normal respiratory effort, no problems with respiration noted  Mental Status: Normal mood and affect. Normal behavior. Normal judgment and thought content.  Rest of physical exam deferred due to type of encounter  Imaging: No results found.  Assessment and Plan:  Pregnancy: G1P0000 at 371w4d. Encounter for supervision of low-risk pregnancy in third trimester - Low risk - For waterbirth, all requirements met - Strong rapport with Beth ParrCNM. Will attempt  to schedule remaining visits with her.   2. [redacted] weeks gestation of pregnancy - Preemptive teaching vaginal swabs next visit  Preterm labor symptoms and general obstetric precautions including but not limited to vaginal bleeding, contractions, leaking of fluid and fetal movement were reviewed in detail with the patient. I discussed the assessment and treatment plan with the patient. The patient was provided an opportunity to ask questions and all were answered. The patient agreed with the plan and demonstrated an understanding of the instructions. The patient was advised to call back or seek an in-person office evaluation/[facility] or concerning [facility]. Please refer to After Visit Summary for other counseling recommendations.   I provided ten minutes of face-to-face time during this encounter.    Future Appointments  Date Time Provider [place]3/05/2021 10:35 AM WaHelaine ChessMAbra[facility]3/16/2022 10:35 AM WaGabriel CarinaCNM [facility]3/23/2022 10:35 AM WaGabriel CarinaCNM [facility]4/02/2021 10:35 AM WaGabriel CarinaCNM [facility] WMLivermoreCNPr[place]de[place] WoDean Foods CompanyCoHouston[facility]

## 2020-11-01 NOTE — Progress Notes (Signed)
I connected with  Beth Newton on 11/01/20 at 0927 by MyChart and verified that I am speaking with the correct person using two identifiers.   I discussed the limitations, risks, security and privacy concerns of performing an evaluation and management service by telephone and the availability of in person appointments. I also discussed with the patient that there may be a patient responsible charge related to this service. The patient expressed understanding and agreed to proceed.  Marjo Bicker, RN 11/01/2020  9:27 AM

## 2020-11-13 ENCOUNTER — Ambulatory Visit (INDEPENDENT_AMBULATORY_CARE_PROVIDER_SITE_OTHER): Payer: BC Managed Care – PPO | Admitting: Certified Nurse Midwife

## 2020-11-13 ENCOUNTER — Other Ambulatory Visit (HOSPITAL_COMMUNITY)
Admission: RE | Admit: 2020-11-13 | Discharge: 2020-11-13 | Disposition: A | Discharge status: Home / Self Care | Payer: BC Managed Care – PPO | Source: Ambulatory Visit | Attending: Certified Nurse Midwife | Admitting: Certified Nurse Midwife

## 2020-11-13 ENCOUNTER — Encounter: Payer: BC Managed Care – PPO | Admitting: Certified Nurse Midwife

## 2020-11-13 ENCOUNTER — Other Ambulatory Visit: Payer: Self-pay

## 2020-11-13 VITALS — BP 114/83 | HR 116 | Wt 253.0 lb

## 2020-11-13 DIAGNOSIS — Z3493 Encounter for supervision of normal pregnancy, unspecified, third trimester: Secondary | ICD-10-CM | POA: Insufficient documentation

## 2020-11-13 DIAGNOSIS — Z3A36 36 weeks gestation of pregnancy: Secondary | ICD-10-CM

## 2020-11-13 NOTE — Progress Notes (Signed)
   PRENATAL VISIT NOTE  Subjective:  Beth Newton is a 25 y.o. G1P0000 at [redacted]w[redacted]d being seen today for ongoing prenatal care.  She is currently monitored for the following issues for this low-risk pregnancy and has Supervision of low-risk pregnancy and Other constipation on their problem list.  Patient reports occasional backache.  Contractions: Not present. Vag. Bleeding: None.  Movement: Present. Denies leaking of fluid.   The following portions of the patient's history were reviewed and updated as appropriate: allergies, current medications, past family history, past medical history, past social history, past surgical history and problem list.   Objective:   Vitals:   11/13/20 1616  BP: 114/83  Pulse: (!) 116  Weight: 253 lb (114.8 kg)    Fetal Status: Fetal Heart Rate (bpm): 145 Fundal Height: 36 cm Movement: Present     General:  Alert, oriented and cooperative. Patient is in no acute distress.  Skin: Skin is warm and dry. No rash noted.   Cardiovascular: Normal heart rate noted  Respiratory: Normal respiratory effort, no problems with respiration noted  Abdomen: Soft, gravid, appropriate for gestational age.  Pain/Pressure: Present     Pelvic: Cervical exam deferred        Extremities: Normal range of motion.  Edema: Trace  Mental Status: Normal mood and affect. Normal behavior. Normal judgment and thought content.   Assessment and Plan:  Pregnancy: G1P0000 at [redacted]w[redacted]d 1. Encounter for supervision of low-risk pregnancy in third trimester - Doing well, no complaints - Requested that next visit be virtual - Encouraged regular stretching/movement to facilitate alignment of the baby in the pelvis and ease backache  2. [redacted] weeks gestation of pregnancy - Routine OB care - GC/Chlamydia probe amp ([place])not at [facility] - Culture, beta strep (group b only)  Preterm labor symptoms and general obstetric precautions including but not limited to vaginal bleeding, contractions, leaking  of fluid and fetal movement were reviewed in detail with the patient. Please refer to After Visit Summary for other counseling recommendations.   Return in about 1 week (around 11/20/2020) for VIRTUAL, LOB.  Future Appointments  Date Time Provider Department Center  11/20/2020 10:35 AM Osborne Oman [facility]  11/27/2020 10:35 AM Bernerd Limbo, CNM [facility]  12/11/2020 10:35 AM Bernerd Limbo, CNM [facility]    Bernerd Limbo, CNM

## 2020-11-14 LAB — GC/CHLAMYDIA PROBE AMP (~~LOC~~) NOT AT ARMC
Chlamydia: NEGATIVE
Comment: NEGATIVE
Comment: NORMAL
Neisseria Gonorrhea: NEGATIVE

## 2020-11-17 LAB — CULTURE, BETA STREP (GROUP B ONLY): Strep Gp B Culture: NEGATIVE

## 2020-11-20 ENCOUNTER — Telehealth (INDEPENDENT_AMBULATORY_CARE_PROVIDER_SITE_OTHER): Payer: BC Managed Care – PPO | Admitting: Certified Nurse Midwife

## 2020-11-20 DIAGNOSIS — Z3493 Encounter for supervision of normal pregnancy, unspecified, third trimester: Secondary | ICD-10-CM

## 2020-11-20 DIAGNOSIS — Z3A37 37 weeks gestation of pregnancy: Secondary | ICD-10-CM

## 2020-11-20 NOTE — Progress Notes (Signed)
I connected with  Beth Newton on 11/20/20 at 10:35 AM EDT by MyChart and verified that I am speaking with the correct person using two identifiers.   I discussed the limitations, risks, security and privacy concerns of performing an evaluation and management service by telephone and the availability of in person appointments. I also discussed with the patient that there may be a patient responsible charge related to this service. The patient expressed understanding and agreed to proceed.  Marjo Bicker, RN 11/20/2020  10:52 AM

## 2020-11-23 ENCOUNTER — Encounter: Payer: Self-pay | Admitting: Certified Nurse Midwife

## 2020-11-23 NOTE — Progress Notes (Signed)
   OBSTETRICS PRENATAL VIRTUAL VISIT ENCOUNTER NOTE  Provider location: [facility]   I connected with Beth Newton on 11/23/20 at 10:35 AM EDT by MyChart Video Encounter at home and verified that I am speaking with the correct person using two identifiers.   I discussed the limitations, risks, security and privacy concerns of performing an evaluation and management service virtually and the availability of in person appointments. I also discussed with the patient that there may be a patient responsible charge related to this service. The patient expressed understanding and agreed to proceed. Subjective:  Beth Newton is a 25 y.o. G1P0000 at [redacted]w[redacted]d being seen today for ongoing prenatal care.  She is currently monitored for the following issues for this low-risk pregnancy and has Supervision of low-risk pregnancy and Other constipation on their problem list.  Patient reports no complaints.  Contractions: Not present. Vag. Bleeding: None.  Movement: Present. Denies any leaking of fluid.   The following portions of the patient's history were reviewed and updated as appropriate: allergies, current medications, past family history, past medical history, past social history, past surgical history and problem list.   Objective:  There were no vitals filed for this visit.  Fetal Status:     Movement: Present     General:  Alert, oriented and cooperative. Patient is in no acute distress.  Respiratory: Normal respiratory effort, no problems with respiration noted  Mental Status: Normal mood and affect. Normal behavior. Normal judgment and thought content.  Rest of physical exam deferred due to type of encounter  Imaging: No results found.  Assessment and Plan:  Pregnancy: G1P0000 at [redacted]w[redacted]d 1. Encounter for supervision of low-risk pregnancy in third trimester - Pt doing well, feeling plenty of fetal movement. Still working, wants to go out at [employer] and would  like work note. Will write when needed.  2. [redacted] weeks gestation of pregnancy - Discussed nutrition and rest needs at end of pregnancy, anticipatory guidance given regarding future visits  Term labor symptoms and general obstetric precautions including but not limited to vaginal bleeding, contractions, leaking of fluid and fetal movement were reviewed in detail with the patient. I discussed the assessment and treatment plan with the patient. The patient was provided an opportunity to ask questions and all were answered. The patient agreed with the plan and demonstrated an understanding of the instructions. The patient was advised to call back or seek an in-person office evaluation/go to [facility] at [facility] for any urgent or concerning symptoms. Please refer to After Visit Summary for other counseling recommendations.   I provided 12 minutes of face-to-face time during this encounter.  Future Appointments  Date Time Provider Department Center  11/27/2020 10:35 AM Osborne Oman [facility]  12/11/2020 10:35 AM Bernerd Limbo, CNM [facility]    Bernerd Limbo, CNM Center for [employer]

## 2020-11-27 ENCOUNTER — Other Ambulatory Visit: Payer: Self-pay

## 2020-11-27 ENCOUNTER — Encounter: Payer: Self-pay | Admitting: Certified Nurse Midwife

## 2020-11-27 ENCOUNTER — Ambulatory Visit (INDEPENDENT_AMBULATORY_CARE_PROVIDER_SITE_OTHER): Payer: BC Managed Care – PPO | Admitting: Certified Nurse Midwife

## 2020-11-27 VITALS — BP 120/75 | HR 105 | Wt 252.1 lb

## 2020-11-27 DIAGNOSIS — Z3A38 38 weeks gestation of pregnancy: Secondary | ICD-10-CM

## 2020-11-27 DIAGNOSIS — Z3403 Encounter for supervision of normal first pregnancy, third trimester: Secondary | ICD-10-CM

## 2020-11-27 NOTE — Progress Notes (Signed)
Patient complains of swelling in her feet that happens "every other day" and pressure on pelvic. Also states occasional braxton hicks. Patient has no concerns today.

## 2020-11-28 NOTE — Progress Notes (Signed)
   PRENATAL VISIT NOTE  Subjective:  Beth Newton is a 25 y.o. G1P0000 at [redacted]w[redacted]d being seen today for ongoing prenatal care.  She is currently monitored for the following issues for this low-risk pregnancy and has Supervision of low-risk pregnancy on their problem list.  Patient reports occasional contractions, swelling in her hands/feet after work that goes away after elevation.  Contractions: Not present. Vag. Bleeding: None.  Movement: Present. Denies leaking of fluid.   The following portions of the patient's history were reviewed and updated as appropriate: allergies, current medications, past family history, past medical history, past social history, past surgical history and problem list.   Objective:   Vitals:   11/27/20 1037  BP: 120/75  Pulse: (!) 105  Weight: 252 lb 1.6 oz (114.4 kg)    Fetal Status: Fetal Heart Rate (bpm): 142 Fundal Height: 38 cm Movement: Present     General:  Alert, oriented and cooperative. Patient is in no acute distress.  Skin: Skin is warm and dry. No rash noted.   Cardiovascular: Normal heart rate noted  Respiratory: Normal respiratory effort, no problems with respiration noted  Abdomen: Soft, gravid, appropriate for gestational age.  Pain/Pressure: Present     Pelvic: Cervical exam deferred        Extremities: Normal range of motion.  Edema: Trace  Mental Status: Normal mood and affect. Normal behavior. Normal judgment and thought content.   Assessment and Plan:  Pregnancy: G1P0000 at [redacted]w[redacted]d 1. Encounter for supervision of normal first pregnancy in third trimester - Doing well, feeling vigorous fetal movements  2. [redacted] weeks gestation of pregnancy - Routine OB care - Planning to stay at work as long as possible  Term labor symptoms and general obstetric precautions including but not limited to vaginal bleeding, contractions, leaking of fluid and fetal movement were reviewed in detail with the patient. Please refer to After Visit Summary for  other counseling recommendations.   Return in about 1 week (around 12/04/2020) for VIRTUAL, LOB.  Future Appointments  Date Time Provider Department Center  12/11/2020 10:35 AM Bernerd Limbo, CNM [facility]    Bernerd Limbo, CNM

## 2020-12-04 ENCOUNTER — Encounter: Payer: Self-pay | Admitting: *Deleted

## 2020-12-11 ENCOUNTER — Other Ambulatory Visit: Payer: Self-pay

## 2020-12-11 ENCOUNTER — Inpatient Hospital Stay (HOSPITAL_COMMUNITY)
Admission: AD | Admit: 2020-12-11 | Discharge: 2020-12-14 | DRG: 807 | Disposition: A | Discharge status: Home / Self Care | Payer: BC Managed Care – PPO | Attending: Obstetrics & Gynecology | Admitting: Obstetrics & Gynecology

## 2020-12-11 ENCOUNTER — Ambulatory Visit (INDEPENDENT_AMBULATORY_CARE_PROVIDER_SITE_OTHER): Payer: BC Managed Care – PPO | Admitting: Certified Nurse Midwife

## 2020-12-11 ENCOUNTER — Encounter (HOSPITAL_COMMUNITY): Payer: Self-pay | Admitting: Obstetrics and Gynecology

## 2020-12-11 ENCOUNTER — Encounter: Payer: Self-pay | Admitting: *Deleted

## 2020-12-11 ENCOUNTER — Other Ambulatory Visit: Payer: BC Managed Care – PPO

## 2020-12-11 VITALS — BP 123/93 | HR 99 | Wt 255.1 lb

## 2020-12-11 DIAGNOSIS — D563 Thalassemia minor: Secondary | ICD-10-CM | POA: Diagnosis present

## 2020-12-11 DIAGNOSIS — Z3A40 40 weeks gestation of pregnancy: Secondary | ICD-10-CM | POA: Diagnosis not present

## 2020-12-11 DIAGNOSIS — Z20822 Contact with and (suspected) exposure to covid-19: Secondary | ICD-10-CM | POA: Diagnosis present

## 2020-12-11 DIAGNOSIS — Z3493 Encounter for supervision of normal pregnancy, unspecified, third trimester: Secondary | ICD-10-CM

## 2020-12-11 DIAGNOSIS — O9902 Anemia complicating childbirth: Secondary | ICD-10-CM | POA: Diagnosis present

## 2020-12-11 DIAGNOSIS — O99019 Anemia complicating pregnancy, unspecified trimester: Secondary | ICD-10-CM | POA: Diagnosis present

## 2020-12-11 DIAGNOSIS — R030 Elevated blood-pressure reading, without diagnosis of hypertension: Secondary | ICD-10-CM

## 2020-12-11 DIAGNOSIS — O480 Post-term pregnancy: Secondary | ICD-10-CM | POA: Diagnosis present

## 2020-12-11 DIAGNOSIS — O134 Gestational [pregnancy-induced] hypertension without significant proteinuria, complicating childbirth: Secondary | ICD-10-CM | POA: Diagnosis present

## 2020-12-11 DIAGNOSIS — O133 Gestational [pregnancy-induced] hypertension without significant proteinuria, third trimester: Secondary | ICD-10-CM

## 2020-12-11 DIAGNOSIS — O139 Gestational [pregnancy-induced] hypertension without significant proteinuria, unspecified trimester: Secondary | ICD-10-CM | POA: Diagnosis present

## 2020-12-11 LAB — POCT URINALYSIS DIP (DEVICE)
Bilirubin Urine: NEGATIVE
Glucose, UA: NEGATIVE mg/dL
Hgb urine dipstick: NEGATIVE
Ketones, ur: NEGATIVE mg/dL
Leukocytes,Ua: NEGATIVE
Nitrite: NEGATIVE
Protein, ur: NEGATIVE mg/dL
Specific Gravity, Urine: 1.025 (ref 1.005–1.030)
Urobilinogen, UA: 1 mg/dL (ref 0.0–1.0)
pH: 6.5 (ref 5.0–8.0)

## 2020-12-11 LAB — COMPREHENSIVE METABOLIC PANEL
ALT: 13 U/L (ref 0–44)
AST: 20 U/L (ref 15–41)
Albumin: 3.1 g/dL — ABNORMAL LOW (ref 3.5–5.0)
Alkaline Phosphatase: 164 U/L — ABNORMAL HIGH (ref 38–126)
Anion gap: 8 (ref 5–15)
BUN: <5 mg/dL — ABNORMAL LOW (ref 6–20)
CO2: 19 mmol/L — ABNORMAL LOW (ref 22–32)
Calcium: 9.3 mg/dL (ref 8.9–10.3)
Chloride: 107 mmol/L (ref 98–111)
Creatinine, Ser: 0.65 mg/dL (ref 0.44–1.00)
GFR, Estimated: >60 mL/min (ref >60)
Glucose, Bld: 81 mg/dL (ref 70–99)
Potassium: 3.7 mmol/L (ref 3.5–5.1)
Sodium: 134 mmol/L — ABNORMAL LOW (ref 135–145)
Total Bilirubin: 1 mg/dL (ref 0.3–1.2)
Total Protein: 6.7 g/dL (ref 6.5–8.1)

## 2020-12-11 LAB — CBC
HCT: 36.8 % (ref 36.0–46.0)
Hemoglobin: 11.8 g/dL — ABNORMAL LOW (ref 12.0–15.0)
MCH: 27.8 pg (ref 26.0–34.0)
MCHC: 32.1 g/dL (ref 30.0–36.0)
MCV: 86.8 fL (ref 80.0–100.0)
Platelets: 265 10*3/uL (ref 150–400)
RBC: 4.24 MIL/uL (ref 3.87–5.11)
RDW: 13.8 % (ref 11.5–15.5)
WBC: 8 10*3/uL (ref 4.0–10.5)
nRBC: 0 % (ref 0.0–0.2)

## 2020-12-11 LAB — PROTEIN / CREATININE RATIO, URINE
Creatinine, Urine: 123.25 mg/dL
Protein Creatinine Ratio: 0.15 mg/mg{Cre} (ref 0.00–0.15)
Total Protein, Urine: 18 mg/dL

## 2020-12-11 LAB — RESP PANEL BY RT-PCR (FLU A&B, COVID) ARPGX2
Influenza A by PCR: NEGATIVE
Influenza B by PCR: NEGATIVE
SARS Coronavirus 2 by RT PCR: NEGATIVE

## 2020-12-11 LAB — TYPE AND SCREEN
ABO/RH(D): A POS
Antibody Screen: NEGATIVE

## 2020-12-11 MED ORDER — ONDANSETRON HCL 4 MG/2ML IJ SOLN
4.0000 mg | Freq: Four times a day (QID) | INTRAMUSCULAR | Status: DC | PRN
  Administered 2020-12-11 – 2020-12-12 (×2): 4 mg via INTRAVENOUS
  Filled 2020-12-11 (×2): qty 2

## 2020-12-11 MED ORDER — OXYCODONE-ACETAMINOPHEN 5-325 MG PO TABS: 1.0000 | ORAL_TABLET | ORAL | Status: DC | PRN

## 2020-12-11 MED ORDER — OXYTOCIN-SODIUM CHLORIDE 30-0.9 UT/500ML-% IV SOLN
2.5000 [IU]/h | INTRAVENOUS | Status: DC
  Filled 2020-12-11: qty 500

## 2020-12-11 MED ORDER — LACTATED RINGERS IV SOLN: 500.0000 mL | INTRAVENOUS | Status: DC | PRN

## 2020-12-11 MED ORDER — MISOPROSTOL 50MCG HALF TABLET
50.0000 ug | ORAL_TABLET | ORAL | Status: DC | PRN
  Administered 2020-12-11: 50 ug via BUCCAL
  Filled 2020-12-11: qty 1

## 2020-12-11 MED ORDER — SOD CITRATE-CITRIC ACID 500-334 MG/5ML PO SOLN: 30.0000 mL | ORAL | Status: DC | PRN

## 2020-12-11 MED ORDER — OXYTOCIN BOLUS FROM INFUSION
333.0000 mL | Freq: Once | INTRAVENOUS | Status: AC
  Administered 2020-12-12: 333 mL via INTRAVENOUS

## 2020-12-11 MED ORDER — OXYCODONE-ACETAMINOPHEN 5-325 MG PO TABS: 2.0000 | ORAL_TABLET | ORAL | Status: DC | PRN

## 2020-12-11 MED ORDER — TERBUTALINE SULFATE 1 MG/ML IJ SOLN: 0.2500 mg | Freq: Once | INTRAMUSCULAR | Status: DC | PRN

## 2020-12-11 MED ORDER — MISOPROSTOL 50MCG HALF TABLET
ORAL_TABLET | ORAL | Status: AC
  Filled 2020-12-11: qty 1

## 2020-12-11 MED ORDER — LIDOCAINE HCL (PF) 1 % IJ SOLN
30.0000 mL | INTRAMUSCULAR | Status: DC | PRN
  Administered 2020-12-12: 30 mL via SUBCUTANEOUS
  Filled 2020-12-11: qty 30

## 2020-12-11 MED ORDER — LACTATED RINGERS IV SOLN: INTRAVENOUS | Status: DC

## 2020-12-11 MED ORDER — ACETAMINOPHEN 325 MG PO TABS: 650.0000 mg | ORAL_TABLET | ORAL | Status: DC | PRN

## 2020-12-11 NOTE — Progress Notes (Signed)
Chaplain paged by RN to consult regarding prayer space for FOB who is observing Ramadan.  Chaplain introduced spiritual care services and offered support.  FOB was out of the hospital during my visit.  Conducted spiritual assessment and provided directions to [employer]	[place], a location on the floor, and a prayer rug in case the FOB wants to remain in the pt's room. Pt shared that she is hoping to have a water birth and would like to avoid medicine as much as possible, so she is trying to remain as calm as possible to keep her blood pressure down to allow for the water birth. Pt shared that her mother and father are both deceased and her mother in law is also deceased. 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 Chaplain explored strategies for coping and encouraged pt to share with the medical team if she notices ways in which we can help her feel more comfortable in this space.  Chaplain informed pt's RN for continuity of care.  Please page as further needs arise.  Maryanna Shape. Carley Hammed, M.Div. [facility] Chaplain Pager [phone] Office [phone]

## 2020-12-11 NOTE — H&P (Signed)
Beth Newton is a 25 y.o. female G1P0000 with IUP at [redacted]w[redacted]d by LMP here for IOL for post-date presenting for concern for new onset of elevated blood pressures and protein in urine at her appointment.   She reports positive fetal movement. She denies leakage of fluid or vaginal bleeding.  Patient denies blurry vision, ha, floating spots, RUQ pain. She has had an uncomplicated prenatal course.  Prenatal History/Complications: PNC at [place] Pregnancy complications:  - Past Medical History: Past Medical History:  Diagnosis Date  . Other constipation 06/11/2020    Past Surgical History: History reviewed. No pertinent surgical history.  Obstetrical History: OB History    Gravida  1   Para  0   Term  0   Preterm  0   AB  0   Living  0     SAB  0   IAB  0   Ectopic  0   Multiple  0   Live Births  0            Social History: Social History   Socioeconomic History  . Marital status: Married    Spouse name: Beth Newton  . Number of children: Not on file  . Years of education: Not on file  . Highest education level: Not on file  Occupational History  . Not on file  Tobacco Use  . Smoking status: Never Smoker  . Smokeless tobacco: Never Used  Vaping Use  . Vaping Use: Never used  Substance and Sexual Activity  . Alcohol use: No  . Drug use: Never  . Sexual activity: Yes    Birth control/protection: None  Other Topics Concern  . Not on file  Social History Narrative  . Not on file   Social Determinants of Health   Financial Resource Strain: Not on file  Food Insecurity: No Food Insecurity  . Worried About Programme researcher, broadcasting/film/video in the Last Year: Never true  . Ran Out of Food in the Last Year: Never true  Transportation Needs: No Transportation Needs  . Lack of Transportation (Medical): No  . Lack of Transportation (Non-Medical): No  Physical Activity: Not on file  Stress: Not on file  Social Connections: Not on file    Family History: Family  History  Problem Relation Age of Onset  . Healthy Mother   . Healthy Father     Allergies: No Known Allergies  Medications Prior to Admission  Medication Sig Dispense Refill Last Dose  . Prenatal Vit-Fe Fumarate-FA (PRENATAL VITAMIN PO) Take 1 tablet by mouth daily.   12/10/2020 at Unknown time  . docusate calcium (SURFAK) 240 MG capsule Take 1 capsule (240 mg total) by mouth daily. (Patient not taking: No sig reported) 30 capsule 2   . Elastic Bandages & Supports (COMFORT FIT MATERNITY SUPP LG) MISC 1 Units by Does not apply route daily. 1 each 0   . Misc. Devices (BREAST PUMP) MISC Dispense one breast pump for patient (Patient not taking: No sig reported) 1 each 0     Review of Systems   Constitutional: Negative for fever and chills Eyes: Negative for visual disturbances Respiratory: Negative for shortness of breath, dyspnea Cardiovascular: Negative for chest pain or palpitations  Gastrointestinal: Negative for vomiting, diarrhea and constipation.  POSITIVE for abdominal pain (contractions) Genitourinary: Negative for dysuria and urgency Musculoskeletal: Negative for back pain, joint pain, myalgias  Neurological: Negative for dizziness and headaches  Blood pressure 138/89, pulse 98, temperature 98.6 F (37 C), temperature  source Oral, resp. rate 16, height 5\' 7"  (1.702 m), weight 116 kg, last menstrual period 03/04/2020. General appearance: alert, cooperative and no distress Lungs: normal respiratory effort Heart: regular rate and rhythm Abdomen: soft, non-tender; bowel sounds normal Extremities: Homans sign is negative, no sign of DVT DTR's 2+ Presentation: cephalic Fetal monitoring  Baseline: 130 bpm, mod var, present acel, no decels,.  Uterine activity  None Dilation: 1 Effacement (%): 30 Station: -3 Exam by:: Beth Newton   Prenatal labs: ABO, Rh: --/--/PENDING (04/06 1350) Antibody: PENDING (04/06 1350) Rubella: 4.16 (09/07 1316) RPR: Non Reactive  (01/05 0814)  HBsAg: Negative (09/07 1316)  HIV: Non Reactive (01/05 0814)  GBS: Negative/-- (03/09 1643)  1 hr Glucola passed Genetic screening  Normal, patient is silent carrier for alpha thalassemia and partner was not tested Anatomy 12-15-1980 normal  Prenatal Transfer Tool  Maternal Diabetes: No Genetic Screening: Normal Maternal Ultrasounds/Referrals: Normal Fetal Ultrasounds or other Referrals:  None Maternal Substance Abuse:  No Significant Maternal Medications:  None Significant Maternal Lab Results: Group B Strep negative  Results for orders placed or performed during the hospital encounter of 12/11/20 (from the past 24 hour(s))  CBC   Collection Time: 12/11/20  1:50 PM  Result Value Ref Range   WBC 8.0 4.0 - 10.5 K/uL   RBC 4.24 3.87 - 5.11 MIL/uL   Hemoglobin 11.8 (L) 12.0 - 15.0 g/dL   HCT 02/10/21 85.8 - 85.0 %   MCV 86.8 80.0 - 100.0 fL   MCH 27.8 26.0 - 34.0 pg   MCHC 32.1 30.0 - 36.0 g/dL   RDW 27.7 41.2 - 87.8 %   Platelets 265 150 - 400 K/uL   nRBC 0.0 0.0 - 0.2 %  Type and screen [facility]   Collection Time: 12/11/20  1:50 PM  Result Value Ref Range   ABO/RH(D) PENDING    Antibody Screen PENDING    Sample Expiration      12/14/2020,2359 Performed at [facility], [address], [place], [place]   Results for orders placed or performed in visit on 12/11/20 (from the past 24 hour(s))  POCT urinalysis dip (device)   Collection Time: 12/11/20 11:12 AM  Result Value Ref Range   Glucose, UA NEGATIVE NEGATIVE mg/dL   Bilirubin Urine NEGATIVE NEGATIVE   Ketones, ur NEGATIVE NEGATIVE mg/dL   Specific Gravity, Urine 1.025 1.005 - 1.030   Hgb urine dipstick NEGATIVE NEGATIVE   pH 6.5 5.0 - 8.0   Protein, ur NEGATIVE NEGATIVE mg/dL   Urobilinogen, UA 1.0 0.0 - 1.0 mg/dL   Nitrite NEGATIVE NEGATIVE   Leukocytes,Ua NEGATIVE NEGATIVE    Assessment: Beth Newton is a 25 y.o. G1P0000 with an IUP at [redacted]w[redacted]d presenting for IOL for post  dates and concern for elevated BP.  -she still desires waterbirth; reviewed certificate onfile -discussed methods of IOL, including exclusion criteria for WB (infection, concern for FHR, pitocin, GHTN). Patient understands limitations to waterbirth.   -given 1 cytotec, per Newton cervix is 1 cm and anterior so will attempt FB in 4 hours.   -encourage ambulation, positioning to start labor. Patient ate [redacted]w[redacted]d at 2; will order a light labor diet now.  Plan: #Labor: expectant management #Pain:  Per request #FWB Cat 1 #ID: GBS: [id number]  #MOF:  breast #MOC: condoms #Circ: yes   [place] [employer] 12/11/2020, 2:53 PM

## 2020-12-11 NOTE — Progress Notes (Signed)
Patient ID: Ersel Wadleigh, female   DOB: 19-Jun-1996, 25 y.o.   MRN: [id number]  S/p cytotec x 2 doses; Cook catheter still in place; feels some cramping but overall less uncomfortable than when foley was originally placed; has not had additional elevated BP sine arrival to WC&C  BP 109/71, P 103 FHR 130s, +accels, no decels Ctx irreg, mild Cx deferred  IUP@40 .2wks Elevated BP without dx IOL process  Plan for Pitocin when foley is dislodged Discussion re attempt to still keep waterbirth as part of her labor plan Anticipate vag del  Arabella Merles [facility] 12/11/2020

## 2020-12-11 NOTE — Progress Notes (Signed)
Beth Newton is a 25 y.o. G1P0000 at [redacted]w[redacted]d admitted for induction of labor due to  postdates and elevated BP today in office.   Subjective: Coping well; has been ambulating  Objective: BP 118/68   Pulse 86   Temp 98.6 F (37 C) (Oral)   Resp 16   Ht 5\' 7"  (1.702 m)   Wt 116 kg   LMP 03/04/2020   BMI 40.05 kg/m  No intake/output data recorded.  FHT:  135 bpm, variability: moderate,  accelerations:  Present,  decelerations:  Absent UC:   irregular, every 2-3 minutes  SVE:   Dilation: 1 Effacement (%): Thick Station: -3 Exam by:: [email or web address], CNM    Labs: Lab Results  Component Value Date   WBC 8.0 12/11/2020   HGB 11.8 (L) 12/11/2020   HCT 36.8 12/11/2020   MCV 86.8 12/11/2020   PLT 265 12/11/2020    Assessment / Plan: COok placed with 80 cc internally.   Patient received 2nd dose of cytotec at 1845 Patient still wants WB; encouraged ambulation and birthing ball to encourage labor  Labor: early Fetal Wellbeing:  Category I Pain Control:  Labor support without medications Pre-eclampsia: asymptomatic; has had normal range BP today I/D:  GBS neg Anticipated MOD: NSVB  02/10/2021 CNM, WHNP-BC 12/11/2020, 7:17 PM

## 2020-12-12 ENCOUNTER — Encounter (HOSPITAL_COMMUNITY): Payer: Self-pay | Admitting: Obstetrics & Gynecology

## 2020-12-12 DIAGNOSIS — O480 Post-term pregnancy: Secondary | ICD-10-CM

## 2020-12-12 DIAGNOSIS — Z3A40 40 weeks gestation of pregnancy: Secondary | ICD-10-CM

## 2020-12-12 LAB — RPR: RPR Ser Ql: NONREACTIVE

## 2020-12-12 MED ORDER — EPHEDRINE 5 MG/ML INJ: 10.0000 mg | INTRAVENOUS | Status: DC | PRN

## 2020-12-12 MED ORDER — DIBUCAINE (PERIANAL) 1 % EX OINT
1.0000 "application " | TOPICAL_OINTMENT | CUTANEOUS | Status: DC | PRN
  Filled 2020-12-12: qty 28

## 2020-12-12 MED ORDER — BENZOCAINE-MENTHOL 20-0.5 % EX AERO
1.0000 "application " | INHALATION_SPRAY | CUTANEOUS | Status: DC | PRN
  Filled 2020-12-12: qty 56

## 2020-12-12 MED ORDER — TERBUTALINE SULFATE 1 MG/ML IJ SOLN: 0.2500 mg | Freq: Once | INTRAMUSCULAR | Status: DC | PRN

## 2020-12-12 MED ORDER — LACTATED RINGERS IV SOLN: 500.0000 mL | Freq: Once | INTRAVENOUS | Status: DC

## 2020-12-12 MED ORDER — ONDANSETRON HCL 4 MG PO TABS: 4.0000 mg | ORAL_TABLET | ORAL | Status: DC | PRN

## 2020-12-12 MED ORDER — TETANUS-DIPHTH-ACELL PERTUSSIS 5-2.5-18.5 LF-MCG/0.5 IM SUSY: 0.5000 mL | PREFILLED_SYRINGE | Freq: Once | INTRAMUSCULAR | Status: DC

## 2020-12-12 MED ORDER — DIPHENHYDRAMINE HCL 25 MG PO CAPS: 25.0000 mg | ORAL_CAPSULE | Freq: Four times a day (QID) | ORAL | Status: DC | PRN

## 2020-12-12 MED ORDER — DIPHENHYDRAMINE HCL 50 MG/ML IJ SOLN: 12.5000 mg | INTRAMUSCULAR | Status: DC | PRN

## 2020-12-12 MED ORDER — PHENYLEPHRINE 40 MCG/ML (10ML) SYRINGE FOR IV PUSH (FOR BLOOD PRESSURE SUPPORT): 80.0000 ug | PREFILLED_SYRINGE | INTRAVENOUS | Status: DC | PRN

## 2020-12-12 MED ORDER — ZOLPIDEM TARTRATE 5 MG PO TABS: 5.0000 mg | ORAL_TABLET | Freq: Every evening | ORAL | Status: DC | PRN

## 2020-12-12 MED ORDER — SIMETHICONE 80 MG PO CHEW: 80.0000 mg | CHEWABLE_TABLET | ORAL | Status: DC | PRN

## 2020-12-12 MED ORDER — ACETAMINOPHEN 325 MG PO TABS: 650.0000 mg | ORAL_TABLET | ORAL | Status: DC | PRN

## 2020-12-12 MED ORDER — OXYTOCIN-SODIUM CHLORIDE 30-0.9 UT/500ML-% IV SOLN
1.0000 m[IU]/min | INTRAVENOUS | Status: DC
  Administered 2020-12-12: 2 m[IU]/min via INTRAVENOUS

## 2020-12-12 MED ORDER — PRENATAL MULTIVITAMIN CH
1.0000 | ORAL_TABLET | Freq: Every day | ORAL | Status: DC
  Administered 2020-12-13 – 2020-12-14 (×2): 1 via ORAL
  Filled 2020-12-12 (×2): qty 1

## 2020-12-12 MED ORDER — COCONUT OIL OIL: 1.0000 "application " | TOPICAL_OIL | Status: DC | PRN

## 2020-12-12 MED ORDER — IBUPROFEN 600 MG PO TABS
600.0000 mg | ORAL_TABLET | Freq: Four times a day (QID) | ORAL | Status: DC
  Administered 2020-12-13 – 2020-12-14 (×7): 600 mg via ORAL
  Filled 2020-12-12 (×7): qty 1

## 2020-12-12 MED ORDER — SENNOSIDES-DOCUSATE SODIUM 8.6-50 MG PO TABS
2.0000 | ORAL_TABLET | Freq: Every day | ORAL | Status: DC
  Administered 2020-12-13 – 2020-12-14 (×2): 2 via ORAL
  Filled 2020-12-12 (×2): qty 2

## 2020-12-12 MED ORDER — WITCH HAZEL-GLYCERIN EX PADS: 1.0000 "application " | MEDICATED_PAD | CUTANEOUS | Status: DC | PRN

## 2020-12-12 MED ORDER — ONDANSETRON HCL 4 MG/2ML IJ SOLN: 4.0000 mg | INTRAMUSCULAR | Status: DC | PRN

## 2020-12-12 MED ORDER — FENTANYL-BUPIVACAINE-NACL 0.5-0.125-0.9 MG/250ML-% EP SOLN
12.0000 mL/h | EPIDURAL | Status: DC | PRN
Start: 2020-12-12 — End: 2020-12-12

## 2020-12-12 NOTE — Discharge Summary (Signed)
Postpartum Discharge Summary       Patient Name: Beth Newton DOB: Aug 20, 1996 MRN: [id number]  Date of admission: 12/11/2020 Delivery date:12/12/2020  Delivering provider: Fatima Blank A  Date of discharge: 12/13/2020  Admitting diagnosis: Indication for care in labor or delivery [O75.9] Intrauterine pregnancy: [redacted]w[redacted]d    Secondary diagnosis:  Active Problems:   Indication for care in labor or delivery   Alpha thalassemia silent carrier   Gestational hypertension  Additional problems: Pregnancy anemia    Discharge diagnosis: Term Pregnancy Delivered                                              Post partum procedures:none Augmentation: AROM, Pitocin, Cytotec and IP Foley Complications: None  Hospital course: Induction of Labor With Vaginal Delivery   25y.o. yo G1P1001 at 462w3das admitted to the hospital 12/11/2020 for induction of labor.  Indication for induction: postterm with BP elevated in office.  Patient had an uncomplicated labor course as follows: Membrane Rupture Time/Date: 2:22 PM ,12/12/2020   Delivery Method:Vaginal, Spontaneous  Episiotomy: None  Lacerations:  2nd degree  Details of delivery can be found in separate delivery note.  Patient's blood pressure was still elevated >2 x after delivery, so Procardia started.  Hgb dropped to 8.3, offered Venofer and pt declined.  Agreed to oral iron. Patient is discharged home 12/13/20.  Newborn Data: Birth date:12/12/2020  Birth time:6:36 PM  Gender:Female  Living status:Living  Apgars:9 ,9  Weight:3575 g   Magnesium Sulfate received: No BMZ received: No Rhophylac:No MMR:No T-DaP:declined Flu: No Transfusion:No  Physical exam  Vitals:   12/12/20 2015 12/12/20 2045 12/12/20 2215 12/13/20 0525  BP: (!) 122/97 128/77 124/82 117/72  Pulse: (!) 118 (!) 112 (!) 103 96  Resp:  '18 18 18  ' Temp:  99 F (37.2 C) 98.9 F (37.2 C) 98.3 F (36.8 C)  TempSrc:  Oral Oral Oral  SpO2:  100% 100% 99%  Weight:       Height:       General: alert, cooperative and no distress Lochia: appropriate Uterine Fundus: firm Incision: N/A DVT Evaluation: No evidence of DVT seen on physical exam. Negative Homan's sign. No cords or calf tenderness. No significant calf/ankle edema. Labs: Lab Results  Component Value Date   WBC 14.3 (H) 12/13/2020   HGB 8.3 (L) 12/13/2020   HCT 26.0 (L) 12/13/2020   MCV 87.2 12/13/2020   PLT 224 12/13/2020   CMP Latest Ref Rng & Units 12/11/2020  Glucose 70 - 99 mg/dL 81  BUN 6 - 20 mg/dL <5(L)  Creatinine 0.44 - 1.00 mg/dL 0.65  Sodium 135 - 145 mmol/L 134(L)  Potassium 3.5 - 5.1 mmol/L 3.7  Chloride 98 - 111 mmol/L 107  CO2 22 - 32 mmol/L 19(L)  Calcium 8.9 - 10.3 mg/dL 9.3  Total Protein 6.5 - 8.1 g/dL 6.7  Total Bilirubin 0.3 - 1.2 mg/dL 1.0  Alkaline Phos 38 - 126 U/L 164(H)  AST 15 - 41 U/L 20  ALT 0 - 44 U/L 13   Edinburgh Score: No flowsheet data found.   After visit meds:  Allergies as of 12/13/2020   No Known Allergies     Medication List    STOP taking these medications   Breast Pump Misc   [place]  TAKE these medications   ferrous sulfate 325 (65 FE) MG tablet Take 1 tablet (325 mg total) by mouth every other day.   ibuprofen 600 MG tablet Commonly known as: ADVIL Take 1 tablet (600 mg total) by mouth every 6 (six) hours.   NIFEdipine 30 MG 24 hr tablet Commonly known as: ADALAT CC Take 1 tablet (30 mg total) by mouth daily.   prenatal multivitamin Tabs tablet Take 1 tablet by mouth daily at 12 noon.        Discharge home in stable condition Infant Feeding: No evidence of DVT seen on physical exam. Negative Homan's sign. No cords or calf tenderness. No significant calf/ankle edema. Infant Disposition:home with mother Discharge instruction: per After Visit Summary and Postpartum booklet. Activity: Advance as tolerated. Pelvic rest for 6 weeks.  Diet: routine diet Future Appointments:No future  appointments. Follow up Visit:  Message sent to [facility] on 12/12/20:  Please schedule this patient for a In person postpartum visit in 4 weeks with the following provider: CNM. Additional Postpartum F/U:BP check 1 week  Low risk pregnancy complicated by: intrapartum HTN Delivery mode:  Vaginal, Spontaneous  Anticipated Birth Control:  Condoms   12/13/2020 Christin Fudge, CNM

## 2020-12-12 NOTE — Progress Notes (Signed)
Labor Progress Note Aristea Cokley is a 25 y.o. G1P0000 at [redacted]w[redacted]d presented for IOL  S: Feeling contractions more   O:  BP 132/81   Pulse (!) 108   Temp 98.1 F (36.7 C) (Oral)   Resp 16   Ht 5\' 7"  (1.702 m)   Wt 116 kg   LMP 03/04/2020   BMI 40.05 kg/m  EFM: 140/mod variability/pos accels/no decels  CVE: Dilation: 6 Effacement (%): 100 Cervical Position: Anterior Station: -1 Presentation: Vertex Exam by:: Samreen Seltzer   A&P: 25 y.o. G1P0000 [redacted]w[redacted]d here for elective IOL  #Labor: Progressing well. S/p cytotec and FB, pitocin started 0100. Plan for AROM when husband is present. Have discussed possibility of not having waterbirth and patient understands.  #Elevated BP reading: PEC labs normal. Subsequent BP's have all been normotensive.  #Pain: per patient request #FWB: cat I  #GBS negative    [redacted]w[redacted]d, MD 1:14 PM

## 2020-12-12 NOTE — Progress Notes (Signed)
Patient ID: Beth Newton, female   DOB: [birth year], 25 y.o.   MRN: [id number]  S/p Cook catheter ~0040; Pit since 0100; feeling the ctx as stronger but still coping well with them  BP 136/85, P 93 FHR 130s, +accels, no decels Ctx q 1-3 mins with Pit at 45mu/min Cx deferred ( was 4-5/50/vtx -3 at start of Pit)  IUP@40 .3wks Elevated BP w/o dx Cx now favorable  -Continue to increase Pit to achieve active labor -Still would like waterbirth if possible to stop Pit later on and if BP remains normal -Anticipate vag del  Arabella Merles CNM 12/12/2020 3:58 AM

## 2020-12-12 NOTE — Progress Notes (Signed)
Patient ID: Beth Newton, female   DOB: 05-Dec-1995, 25 y.o.   MRN: [id number]  Pt roused from sleep; very comfortable- not feeling anything  BP 128/65, P 86 FHR 130-140s, +accels, no decels Ctx irreg and mild, Pit at 45mu/min Cx deferred  IUP@40 .3wks One elevated BP IOL process  Discussion had re waterbirth possibility: she understands that we would need to AROM and then down-titrate the Pit with her body picking up spontaneous ctx in order to get into the tub. We are also watching her BPs, which have been normotensive, as a dx of gHTN would preclude a waterbirth as well.  She is a bit disappointed that she hasn't gotten into active labor yet, but understands the situation.  Dr Myriam Jacobson and Sharen Counter CNM aware.  Arabella Merles CNM 12/12/2020 9:07 AM

## 2020-12-12 NOTE — Lactation Note (Addendum)
This note was copied from a baby's chart. Lactation Consultation Note  Patient Name: Beth Newton Today's Date: 12/12/2020 Reason for consult: L&D Initial assessment;Term;Primapara;1st time breastfeeding Age:25 hours  L&D consult with 48 minutes old infant and P1 mother. Mother and baby are skin to skin at time of consult. Congratulated her on her newborn. Assisted with latch laid back position to right breast. Discussed STS as ideal transition for infants after birth helping with temperature, blood sugar and comfort. Talked about primal reflexes such as rooting, hands to mouth, searching for the breast among others.  Explained LC services availability during postpartum stay. Thanked family for their time.    Maternal Data Does the patient have breastfeeding experience prior to this delivery?: No  Feeding Mother's Current Feeding Choice: Breast Milk  LATCH Score Latch: Grasps breast easily, tongue down, lips flanged, rhythmical sucking.  Audible Swallowing: Spontaneous and intermittent  Type of Nipple: Everted at rest and after stimulation  Comfort (Breast/Nipple): Soft / non-tender  Hold (Positioning): Assistance needed to correctly position infant at breast and maintain latch.  LATCH Score: 9  Interventions Interventions: Assisted with latch;Skin to skin;Education;Breast feeding basics reviewed  Consult Status Consult Status: Follow-up Date: 12/12/20 Follow-up type: In-patient    Beth Newton 12/12/2020, 7:35 PM

## 2020-12-13 ENCOUNTER — Other Ambulatory Visit (HOSPITAL_COMMUNITY): Payer: Self-pay

## 2020-12-13 DIAGNOSIS — O139 Gestational [pregnancy-induced] hypertension without significant proteinuria, unspecified trimester: Secondary | ICD-10-CM | POA: Diagnosis present

## 2020-12-13 DIAGNOSIS — Z3A40 40 weeks gestation of pregnancy: Secondary | ICD-10-CM

## 2020-12-13 DIAGNOSIS — O480 Post-term pregnancy: Principal | ICD-10-CM

## 2020-12-13 LAB — CBC
HCT: 26 % — ABNORMAL LOW (ref 36.0–46.0)
Hemoglobin: 8.3 g/dL — ABNORMAL LOW (ref 12.0–15.0)
MCH: 27.9 pg (ref 26.0–34.0)
MCHC: 31.9 g/dL (ref 30.0–36.0)
MCV: 87.2 fL (ref 80.0–100.0)
Platelets: 224 10*3/uL (ref 150–400)
RBC: 2.98 MIL/uL — ABNORMAL LOW (ref 3.87–5.11)
RDW: 13.7 % (ref 11.5–15.5)
WBC: 14.3 10*3/uL — ABNORMAL HIGH (ref 4.0–10.5)
nRBC: 0 % (ref 0.0–0.2)

## 2020-12-13 MED ORDER — NIFEDIPINE ER 30 MG PO TB24
30.0000 mg | ORAL_TABLET | Freq: Every day | ORAL | 1 refills | Status: AC
  Filled 2020-12-13: qty 30, 30d supply, fill #0

## 2020-12-13 MED ORDER — ACETAMINOPHEN FOR CIRCUMCISION 160 MG/5 ML: 40.0000 mg | Freq: Once | ORAL | Status: DC

## 2020-12-13 MED ORDER — EPINEPHRINE TOPICAL FOR CIRCUMCISION 0.1 MG/ML: 1.0000 [drp] | TOPICAL | Status: DC | PRN

## 2020-12-13 MED ORDER — LIDOCAINE 1% INJECTION FOR CIRCUMCISION
0.8000 mL | INJECTION | Freq: Once | INTRAVENOUS | Status: DC
  Filled 2020-12-13: qty 1

## 2020-12-13 MED ORDER — FERROUS SULFATE 325 (65 FE) MG PO TABS
325.0000 mg | ORAL_TABLET | ORAL | 2 refills | Status: AC
  Filled 2020-12-13: qty 15, 30d supply, fill #0

## 2020-12-13 MED ORDER — SUCROSE 24% NICU/PEDS ORAL SOLUTION: 0.5000 mL | OROMUCOSAL | Status: DC | PRN

## 2020-12-13 MED ORDER — IBUPROFEN 600 MG PO TABS
600.0000 mg | ORAL_TABLET | Freq: Four times a day (QID) | ORAL | 0 refills | Status: AC
  Filled 2020-12-13: qty 30, 8d supply, fill #0

## 2020-12-13 MED ORDER — ACETAMINOPHEN FOR CIRCUMCISION 160 MG/5 ML: 40.0000 mg | ORAL | Status: DC | PRN

## 2020-12-13 MED ORDER — NIFEDIPINE ER OSMOTIC RELEASE 30 MG PO TB24
30.0000 mg | ORAL_TABLET | Freq: Every day | ORAL | Status: DC
  Administered 2020-12-13 – 2020-12-14 (×2): 30 mg via ORAL
  Filled 2020-12-13 (×2): qty 1

## 2020-12-13 MED ORDER — WHITE PETROLATUM EX OINT: 1.0000 "application " | TOPICAL_OINTMENT | CUTANEOUS | Status: DC | PRN

## 2020-12-13 NOTE — Lactation Note (Signed)
This note was copied from a baby's chart. Lactation Consultation Note  Patient Name: Beth Newton FSFSE'L Date: 12/13/2020 Reason for consult: Follow-up assessment;Term;Primapara;1st time breastfeeding Age:25 hours   P1 mother whose infant is now 6 hours old.  This is a term baby at 40+3 weeks.  Mother had baby latched to the left breast in the cradle hold when I arrived.  Asked permission to make some suggestions on how to position more appropriately and mother receptive.  Instructed on good body alignment and breast support during feedings. Suggested the cross cradle hold instead with reasons why this position would be beneficial in the early newborn period.   Asked mother to remove baby from the breast and relatch with the hopes of obtaining a deeper latch.  When mother removed infant her nipple was pinched.  Discussed how to avoid a pinched nipple.  MD in room between latching  And speaking with mother .I obtained coconut oil for nipple comfort.  Upon returning with the coconut oil mother had baby latched again in the cradle hold.  This time the latch appeared deeper.    Mother will feed on cue or at least every 3-4 hours.  She will call her RN/LC for assistance as needed.  Mother has a DEBP for home use.  Father present.   Maternal Data Has patient been taught Hand Expression?: Yes Does the patient have breastfeeding experience prior to this delivery?: No  Feeding Mother's Current Feeding Choice: Breast Milk  LATCH Score Latch: Repeated attempts needed to sustain latch, nipple held in mouth throughout feeding, stimulation needed to elicit sucking reflex.  Audible Swallowing: None  Type of Nipple: Everted at rest and after stimulation  Comfort (Breast/Nipple): Soft / non-tender  Hold (Positioning): Assistance needed to correctly position infant at breast and maintain latch.  LATCH Score: 6   Lactation Tools Discussed/Used    Interventions Interventions: Breast feeding  basics reviewed;Assisted with latch;Skin to skin;Breast massage;Hand express;Breast compression;Adjust position;Position options;Coconut oil;Expressed milk;Education  Discharge Pump: Personal  Consult Status Consult Status: Follow-up Date: 12/14/20 Follow-up type: In-patient    [name] 12/13/2020, 12:50 PM

## 2020-12-13 NOTE — Progress Notes (Signed)
   PRENATAL VISIT NOTE  Subjective:  Beth Newton is a 25 y.o. G1P1001 at [redacted]w[redacted]d being seen today for ongoing prenatal care.  She is currently monitored for the following issues for this low-risk pregnancy and has Supervision of low-risk pregnancy; Indication for care in labor or delivery; Alpha thalassemia silent carrier; and Gestational hypertension on their problem list.  Patient reports occasional contractions, denies headache, epigastric or flank pain or visual disturbances.  Contractions: Irritability. Vag. Bleeding: None.  Movement: Present. Denies leaking of fluid.   The following portions of the patient's history were reviewed and updated as appropriate: allergies, current medications, past family history, past medical history, past social history, past surgical history and problem list.   Objective:   Vitals:   12/11/20 1059  BP: (!) 123/93  Pulse: 99  Weight: 255 lb 1.6 oz (115.7 kg)   Fetal Status: Fetal Heart Rate (bpm): 138 Fundal Height: 40 cm Movement: Present     General:  Alert, oriented and cooperative. Patient is in no acute distress.  Skin: Skin is warm and dry. No rash noted.   Cardiovascular: Normal heart rate noted  Respiratory: Normal respiratory effort, no problems with respiration noted  Abdomen: Soft, gravid, appropriate for gestational age.  Pain/Pressure: Present     Pelvic: Cervical exam deferred        Extremities: Normal range of motion.  Edema: Moderate pitting, indentation subsides rapidly  Mental Status: Normal mood and affect. Normal behavior. Normal judgment and thought content.   Assessment and Plan:  Pregnancy: G1P1001 at [redacted]w[redacted]d 1. Elevated blood pressure reading without diagnosis of hypertension - Discussed options for management of post-dates pregnancy - suggested pt go for IOL today given she is post-dates and had an elevated BP. Advised doing so before she has another elevated BP helps increase her chances of having a waterbirth, if she has  another one that will rule her out for waterbirth. Pt expressed understanding. - Called L&D provider Luna Kitchens, CNM) and discussed admission for IOL. Pt to leave [facility]U for I[facility] at [facility]  2. Encounter for supervision of low-risk pregnancy in third trimester - Pt doing well, has some mild swelling and occasional contractions  3. [redacted] weeks gestation of pregnancy - Routine OB care  Pt sent to MAU for IOL, stopping to get baby bag and eat.  Return in about 5 weeks (around 01/15/2021) for PP, IN-PERSON.  Edd Arbour, CNM, MSN, IBCLC Certified Nurse Midwife, [facility]

## 2020-12-14 DIAGNOSIS — O99019 Anemia complicating pregnancy, unspecified trimester: Secondary | ICD-10-CM | POA: Diagnosis present

## 2020-12-14 MED ORDER — VITAMIN C 250 MG PO TABS: 250.0000 mg | ORAL_TABLET | ORAL | Status: AC

## 2020-12-14 NOTE — Discharge Summary (Signed)
Postpartum Discharge Summary       Patient Name: Beth Newton DOB: 1996/08/17 MRN: [id number]  Date of admission: 12/11/2020 Delivery date:12/12/2020  Delivering provider: Fatima Blank A  Date of discharge: 12/14/2020  Admitting diagnosis: Indication for care in labor or delivery [O75.9] Intrauterine pregnancy: [redacted]w[redacted]d    Secondary diagnosis:  Active Problems:   Indication for care in labor or delivery   Alpha thalassemia silent carrier   Gestational hypertension   Vaginal delivery   Anemia of pregnancy  Additional problems: none    Discharge diagnosis: Term Pregnancy Delivered                                              Post partum procedures:none Augmentation: AROM, Pitocin, Cytotec and IP Foley Complications: None  Hospital course: Induction of Labor With Vaginal Delivery   25y.o. yo G1P1001 at 253w3das admitted to the hospital 12/11/2020 for induction of labor.  Indication for induction: postterm with BP elevated in office.  Patient had an uncomplicated labor course as follows: Membrane Rupture Time/Date: 2:22 PM ,12/12/2020   Delivery Method:Vaginal, Spontaneous  Episiotomy: None  Lacerations:  2nd degree  Details of delivery can be found in separate delivery note.  Patient's blood pressure was still elevated >2 x after delivery, so Procardia started.  Hgb dropped to 8.3, offered Venofer and pt declined.  Agreed to oral iron. Patient is discharged home 12/14/20.  Newborn Data: Birth date:12/12/2020  Birth time:6:36 PM  Gender:Female  Living status:Living  Apgars:9 ,9  Weight:3575 g   Magnesium Sulfate received: No BMZ received: No Rhophylac:No MMR:No T-DaP:declined Flu: No Transfusion:No  Physical exam  Vitals:   12/13/20 0525 12/13/20 1004 12/13/20 1512 12/13/20 2026  BP: 117/72 114/70 105/71 124/68  Pulse: 96 84 87 87  Resp: '18 16 18 16  ' Temp: 98.3 F (36.8 C) 98.7 F (37.1 C) 98.3 F (36.8 C) 98.9 F (37.2 C)  TempSrc: Oral Oral Axillary  Axillary  SpO2: 99% 100% 100% 100%  Weight:      Height:       General: alert, cooperative and no distress Lochia: appropriate Uterine Fundus: firm Incision: N/A DVT Evaluation: No evidence of DVT seen on physical exam. Negative Homan's sign. No cords or calf tenderness. No significant calf/ankle edema. Labs: Lab Results  Component Value Date   WBC 14.3 (H) 12/13/2020   HGB 8.3 (L) 12/13/2020   HCT 26.0 (L) 12/13/2020   MCV 87.2 12/13/2020   PLT 224 12/13/2020   CMP Latest Ref Rng & Units 12/11/2020  Glucose 70 - 99 mg/dL 81  BUN 6 - 20 mg/dL <5(L)  Creatinine 0.44 - 1.00 mg/dL 0.65  Sodium 135 - 145 mmol/L 134(L)  Potassium 3.5 - 5.1 mmol/L 3.7  Chloride 98 - 111 mmol/L 107  CO2 22 - 32 mmol/L 19(L)  Calcium 8.9 - 10.3 mg/dL 9.3  Total Protein 6.5 - 8.1 g/dL 6.7  Total Bilirubin 0.3 - 1.2 mg/dL 1.0  Alkaline Phos 38 - 126 U/L 164(H)  AST 15 - 41 U/L 20  ALT 0 - 44 U/L 13   Edinburgh Score: No flowsheet data found.   After visit meds:  Allergies as of 12/14/2020   No Known Allergies     Medication List    STOP taking these medications   Breast Pump [place]  Supp Lg Misc     TAKE these medications   FeroSul 325 (65 FE) MG tablet Generic drug: ferrous sulfate Take 1 tablet (325 mg total) by mouth every other day.   ibuprofen 600 MG tablet Commonly known as: ADVIL Take 1 tablet (600 mg total) by mouth every 6 (six) hours.   NIFEdipine 30 MG 24 hr tablet Commonly known as: ADALAT CC Take 1 tablet (30 mg total) by mouth daily.   prenatal multivitamin Tabs tablet Take 1 tablet by mouth daily at 12 noon.   vitamin C 250 MG tablet Commonly known as: ASCORBIC ACID Take 1 tablet (250 mg total) by mouth every other day.        Discharge home in stable condition Infant Feeding: No evidence of DVT seen on physical exam. Negative Homan's sign. No cords or calf tenderness. No significant calf/ankle edema. Infant Disposition:home with  mother Discharge instruction: per After Visit Summary and Postpartum booklet. Activity: Advance as tolerated. Pelvic rest for 6 weeks.  Diet: routine diet Future Appointments:No future appointments. Follow up Visit:  Message sent to [facility] on 12/12/20:  Please schedule this patient for a In person postpartum visit in 4 weeks with the following provider: CNM. Additional Postpartum F/U:BP check 1 week  Low risk pregnancy complicated by: intrapartum HTN Delivery mode:  Vaginal, Spontaneous  Anticipated Birth Control:  Condoms   11/13/6884 Arrie Senate, MD

## 2020-12-14 NOTE — Lactation Note (Signed)
This note was copied from a baby's chart. Lactation Consultation Note  Patient Name: Beth Newton MIWOE'H Date: 12/14/2020 Reason for consult: Follow-up assessment;Term;1st time breastfeeding;Primapara;Infant weight loss Age:25 hours  Visited with mom of 40 hours old FT female, she's a P1. Mom and baby are going home today; baby is at 5% weight loss. Reviewed discharge instructions, prevention/treatment for engorgement and sore nipples and red signs on when to call baby's doctor. LC also provided supplementation guidelines when feeding baby at the breast, parents started Enfamil 20 calorie formula today, baby was cluster feeding.  Offered assistance with latch but mom politely declined, she told LC baby just fed a few minutes ago. Per mom BF is going well and feedings at the breast are now comfortable, she hasn't seen that "pinched" nipple look anymore.   Encouraged to keep doing 8-12 feedings/24 hours and to pump whenever baby is getting formula to protect her supply, mom has a pump at home. FOB present and supportive; he told mom baby has 2 older sisters but this is mom's 1st baby. Parents reported all questions and concerns were answered, they're aware of LC OP services and will contact if needed.   Maternal Data    Feeding Mother's Current Feeding Choice: Breast Milk and Formula  LATCH Score                    Lactation Tools Discussed/Used    Interventions Interventions: Breast feeding basics reviewed;Education  Discharge Discharge Education: Engorgement and breast care;Warning signs for feeding baby  Consult Status Consult Status: Complete Date: 12/14/20 Follow-up type: Call as needed    Zyden Suman Venetia Constable 12/14/2020, 11:31 AM

## 2020-12-25 ENCOUNTER — Ambulatory Visit (INDEPENDENT_AMBULATORY_CARE_PROVIDER_SITE_OTHER): Payer: BC Managed Care – PPO | Admitting: Pharmacist

## 2020-12-25 ENCOUNTER — Other Ambulatory Visit: Payer: Self-pay

## 2020-12-25 VITALS — BP 135/71 | HR 76 | Wt 236.7 lb

## 2020-12-25 DIAGNOSIS — O165 Unspecified maternal hypertension, complicating the puerperium: Secondary | ICD-10-CM

## 2020-12-25 NOTE — Progress Notes (Signed)
Patient here today for postpartum BP check. She denies any headache or dizziness. She has no complaints to note. BP today is at goal. Currently taking procardia Xl 30mg /d with no issues. Will continue medication at current dose. Discussed patient with Dr. who is in agreement. Patient will follow-up at 6 week postpartum check and to call with any concerns.

## 2021-01-14 ENCOUNTER — Encounter: Payer: Self-pay | Admitting: Obstetrics and Gynecology

## 2021-01-14 ENCOUNTER — Ambulatory Visit (INDEPENDENT_AMBULATORY_CARE_PROVIDER_SITE_OTHER): Payer: BC Managed Care – PPO | Admitting: Obstetrics and Gynecology

## 2021-01-14 ENCOUNTER — Other Ambulatory Visit: Payer: Self-pay

## 2021-01-14 DIAGNOSIS — Z3202 Encounter for pregnancy test, result negative: Secondary | ICD-10-CM

## 2021-01-14 LAB — POCT PREGNANCY, URINE: Preg Test, Ur: NEGATIVE

## 2021-01-14 NOTE — Progress Notes (Signed)
Post Partum Visit Note  Beth Newton is a 25 y.o. G67P1001 female who presents for a postpartum visit. She is 4 weeks 5 days postpartum following a normal spontaneous vaginal delivery.  I have fully reviewed the prenatal and intrapartum course. The delivery was at 40w 3d.  Anesthesia: local for repair of 2nd degree laceration. Postpartum course has been uncomplicated. Baby is doing well. Baby is feeding by both breast and bottle - enfimil neuropro. Bleeding staining only. Bowel function is normal. Bladder function is normal. Patient is sexually active. Contraception method is none. Postpartum depression screening: negative.   The pregnancy intention screening data noted above was reviewed. Potential methods of contraception were discussed. The patient elected to proceed with Female Condom.    Edinburgh Postnatal Depression Scale - 01/14/21 1018      Edinburgh Postnatal Depression Scale:  In the Past 7 Days   I have been able to laugh and see the funny side of things. 0    I have looked forward with enjoyment to things. 0    I have blamed myself unnecessarily when things went wrong. 0    I have been anxious or worried for no good reason. 0    I have felt scared or panicky for no good reason. 0    Things have been getting on top of me. 0    I have been so unhappy that I have had difficulty sleeping. 0    I have felt sad or miserable. 0    I have been so unhappy that I have been crying. 0    The thought of harming myself has occurred to me. 0    Edinburgh Postnatal Depression Scale Total 0           Health Maintenance Due  Topic Date Due  . COVID-19 Vaccine (1) Never done  . HPV VACCINES (1 - 2-dose series) Never done       Review of Systems Pertinent items noted in HPI and remainder of comprehensive ROS otherwise negative.  Objective:  BP 132/85   Pulse 63   Ht 5\' 7"  (1.702 m)   Wt 236 lb 11.2 oz (107.4 kg)   BMI 37.07 kg/m    General:  alert, cooperative and no distress    Breasts:  normal  Lungs: clear to auscultation bilaterally  Heart:  regular rate and rhythm  Abdomen: soft, non-tender; bowel sounds normal; no masses,  no organomegaly   Wound   GU exam:  normal, vicryl suture visible at the introitus       Assessment:    There are no diagnoses linked to this encounter.  Normal postpartum exam.   Plan:   Essential components of care per ACOG recommendations:  1.  Mood and well being: Patient with negative depression screening today. Reviewed local resources for support.  - Patient tobacco use? No.   - hx of drug use? No.    2. Infant care and feeding:  -Patient currently breastmilk feeding? Yes. Reviewed importance of draining breast regularly to support lactation.  -Social determinants of health (SDOH) reviewed in EPIC. No concerns  3. Sexuality, contraception and birth spacing - Patient does not want a pregnancy in the next year.   - Reviewed forms of contraception in tiered fashion. Patient desired condoms today.   - Discussed birth spacing of 18 months  4. Sleep and fatigue -Encouraged family/partner/community support of 4 hrs of uninterrupted sleep to help with mood and fatigue  5. Physical Recovery  -  Discussed patients delivery and complications. She describes her labor as good. - Patient had a Vaginal, no problems at delivery. Patient had a 2nd degree laceration. Perineal healing reviewed. Patient expressed understanding - Patient has urinary incontinence? No. - Patient is safe to resume physical and sexual activity  6.  Health Maintenance - HM due items addressed Yes - Last pap smear  Diagnosis  Date Value Ref Range Status  05/14/2020   Final   - Negative for intraepithelial lesion or malignancy (NILM)   Pap smear not done at today's visit.  -Breast Cancer screening indicated? No.   7. Chronic Disease/Pregnancy Condition follow up: Hypertension stable without medication.  - PCP follow up  Catalina Antigua, MD Center  for [facility], [facility]

## 2021-08-06 ENCOUNTER — Encounter (HOSPITAL_COMMUNITY): Payer: Self-pay | Admitting: Emergency Medicine

## 2021-08-06 ENCOUNTER — Ambulatory Visit (HOSPITAL_COMMUNITY)
Admission: EM | Admit: 2021-08-06 | Discharge: 2021-08-06 | Disposition: A | Discharge status: Home / Self Care | Payer: Medicaid Other | Attending: Emergency Medicine | Admitting: Emergency Medicine

## 2021-08-06 ENCOUNTER — Other Ambulatory Visit: Payer: Self-pay

## 2021-08-06 ENCOUNTER — Ambulatory Visit (INDEPENDENT_AMBULATORY_CARE_PROVIDER_SITE_OTHER): Payer: Medicaid Other

## 2021-08-06 DIAGNOSIS — S52571A Other intraarticular fracture of lower end of right radius, initial encounter for closed fracture: Secondary | ICD-10-CM | POA: Diagnosis not present
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
## 2021-08-06 NOTE — ED Notes (Signed)
Made ortho aware of splint ordered.

## 2021-08-06 NOTE — Discharge Instructions (Signed)
Ice, elevate your wrist. Use ibuprofen if needed for pain. Follow up with the orthopedic clinic.

## 2021-12-29 ENCOUNTER — Encounter: Payer: Self-pay | Admitting: General Practice

## 2021-12-29 ENCOUNTER — Telehealth: Payer: Self-pay | Admitting: General Practice
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
## 2021-12-29 NOTE — Telephone Encounter (Signed)
Patient called and left message on nurse voicemail line stating she wants to know if we can give her a covid vaccine religious exemption and also asked about scheduling a post op visit since she had her baby. ? ?Called patient and discussed she will need to request that form be completed by a family medicine doctor or PCP since she is no longer pregnant. Discussed with patient she is welcome to come to [place] with any female health related concerns, pap smear, or contraception but other types of care would be PCP. Patient verbalized understanding. ?

## 2022-03-17 NOTE — Progress Notes (Signed)
Erroneous encounter-disregard

## 2022-03-25 ENCOUNTER — Encounter: Payer: Medicaid Other | Admitting: Family

## 2022-03-25 DIAGNOSIS — Z7689 Persons encountering health services in other specified circumstances: Secondary | ICD-10-CM

## 2024-05-20 ENCOUNTER — Emergency Department
Admission: EM | Admit: 2024-05-20 | Discharge: 2024-05-20 | Disposition: A | Discharge status: Home / Self Care | Attending: Emergency Medicine | Admitting: Emergency Medicine

## 2024-05-20 ENCOUNTER — Other Ambulatory Visit: Payer: Self-pay

## 2024-05-20 DIAGNOSIS — S0501XA Injury of conjunctiva and corneal abrasion without foreign body, right eye, initial encounter: Secondary | ICD-10-CM | POA: Insufficient documentation

## 2024-05-20 DIAGNOSIS — X58XXXA Exposure to other specified factors, initial encounter: Secondary | ICD-10-CM | POA: Insufficient documentation

## 2024-05-20 MED ORDER — KETOROLAC TROMETHAMINE 0.5 % OP SOLN: 1.0000 [drp] | Freq: Four times a day (QID) | OPHTHALMIC | 0 refills | Status: AC

## 2024-05-20 MED ORDER — TETRACAINE HCL 0.5 % OP SOLN
2.0000 [drp] | Freq: Once | OPHTHALMIC | Status: AC
  Administered 2024-05-20: 2 [drp] via OPHTHALMIC
  Filled 2024-05-20: qty 4

## 2024-05-20 MED ORDER — TOBRAMYCIN 0.3 % OP SOLN: 2.0000 [drp] | Freq: Four times a day (QID) | OPHTHALMIC | 0 refills | Status: AC

## 2024-05-20 MED ORDER — FLUORESCEIN SODIUM 1 MG OP STRP
1.0000 | ORAL_STRIP | Freq: Once | OPHTHALMIC | Status: AC
  Administered 2024-05-20: 1 via OPHTHALMIC
  Filled 2024-05-20: qty 1

## 2024-05-20 NOTE — ED Provider Notes (Signed)
 [facility] Provider Note    Event Date/Time   First MD Initiated Contact with Patient 05/20/24 [phone]     (approximate)   History   Eye Pain (Patient scratched R eye yesterday, R eye red/swollen/painful/tearing. AxOx4, ambulatory with steady gait. )   HPI  Haille Tencza is a 28 y.o. female with no significant past medical history presents emergency department with right eye pain.  Patient states the foil covering a pop tart cut the eye yesterday.  States that she has had pain and irritation.  Some burning and watery drainage.  Patient does not wear contacts.  No change in her vision.      Physical Exam   Triage Vital Signs: ED Triage Vitals  Encounter Vitals Group     BP 05/20/24 0642 133/84     Girls Systolic BP Percentile --      Girls Diastolic BP Percentile --      Boys Systolic BP Percentile --      Boys Diastolic BP Percentile --      Pulse Rate 05/20/24 0642 82     Resp 05/20/24 0642 18     Temp 05/20/24 0642 98 F (36.7 C)     Temp src --      SpO2 05/20/24 0642 99 %     Weight 05/20/24 0639 265 lb (120.2 kg)     Height 05/20/24 0639 5' 7 (1.702 m)     Head Circumference --      Peak Flow --      Pain Score 05/20/24 0639 9     Pain Loc --      Pain Education --      Exclude from Growth Chart --     Most recent vital signs: Vitals:   05/20/24 0642  BP: 133/84  Pulse: 82  Resp: 18  Temp: 98 F (36.7 C)  SpO2: 99%     General: Awake, no distress.   CV:  Good peripheral perfusion. Resp:  Normal effort.  Abd:  No distention.   Other:  Right eye with some irritation noted, fluorescein  shows corneal abrasion at 6:00, no other dye uptake noted   ED Results / Procedures / Treatments   Labs (all labs ordered are listed, but only abnormal results are displayed) Labs Reviewed - No data to display   EKG     RADIOLOGY     PROCEDURES:   Procedures  Critical Care:  no Chief Complaint  Patient presents with    Eye Pain    Patient scratched R eye yesterday, R eye red/swollen/painful/tearing. AxOx4, ambulatory with steady gait.       MEDICATIONS ORDERED IN ED: Medications  tetracaine  (PONTOCAINE) 0.5 % ophthalmic solution 2 drop (has no administration in time range)  fluorescein  ophthalmic strip 1 strip (has no administration in time range)     IMPRESSION / MDM / ASSESSMENT AND PLAN / ED COURSE  I reviewed the triage vital signs and the nursing notes.                              Differential diagnosis includes, but is not limited to, corneal abrasion, conjunctivitis, hyphema, globe injury  Patient's presentation is most consistent with acute illness / injury with system symptoms.   Patient states tetanus is up-to-date so we will not need to do tetanus.  Fluorescein  stain did not show any abnormality other than a corneal abrasion at 6:00.  No globe injury suspected.  No hyphema noted on physical exam.  At this time we will go ahead and place patient on antibiotic drops and pain eyedrops.  She was given tobramycin  and Toradol  ophthalmic drops.  She is to follow-up with [facility] on Monday.  She is to call make an appointment.  She is in agreement with this treatment plan.  Discharged in stable condition.  Strict instructions to return if worsening.      FINAL CLINICAL IMPRESSION(S) / ED DIAGNOSES   Final diagnoses:  Abrasion of right cornea, initial encounter     Rx / DC Orders   ED Discharge Orders          Ordered    tobramycin  (TOBREX ) 0.3 % ophthalmic solution  Every 6 hours        05/20/24 0819    ketorolac  (ACULAR ) 0.5 % ophthalmic solution  4 times daily        05/20/24 9180             Note:  This document was prepared using Dragon voice recognition software and may include unintentional dictation errors.    Gasper Devere ORN, PA-C 05/20/24 9175    Jacolyn Pae, MD 05/20/24 [phone]

## 2024-05-20 NOTE — Discharge Instructions (Signed)
 Use medication as prescribed.  Follow-up with [facility] on Monday or Tuesday.  Please tell them you were seen in the emergency department and have a corneal abrasion.  This area will need to be rechecked to make sure it is healing.  Return emergency department if worsening.  Wear dark sunglasses when in sunlight.

## 2024-08-25 NOTE — ED Provider Notes (Signed)
 Patient placed in [facility] pathway, seen and evaluated for chief complaint of RLQ abdominal pain x 4 days with nausea and chills. No vomiting. Sent from UC. Had UA at [facility] but did not check a pregnancy test. Pertinent exam findings include nontoxic, no acute distress.   Patient/parent counseled on process, plan, and necessity for staying for completing the evaluation.      Note By: Metta Lines, PA-C 5:17 PM    [facility] Emergency Department Provider Note  This document was created using the aid of voice recognition Dragon dictation software.   Provider at Bedside: 08/25/2024 9:21 PM  History   Chief Complaint  Patient presents with   Abdominal Pain     History of Present Illness  History obtained from:Patient  Beth Newton is a 28 y.o. female with a complaint of abdominal pain onset 4 days ago. Pain is localized to the RLQ and described as an intermittent, aching and sharp pain that sometimes radiates to the back. Walking somewhat worsens the pain. She went to [facility] and states she was sent here for further evaluation. Patient does report recent cough x 3 days. Patient has been using promethazine DM with some improvement. She denies fever, chills, sore throat, chest pain, shortness of breath, nausea, vomiting, diarrhea, urinary symptoms, vaginal bleeding/discharge, headache, dizziness, or other medical complaints at this time.   Patient's last menstrual period was 08/09/2024 (approximate).  Past Medical History Medical History[1]  Past Surgical History Surgical History[2]  Current Medications Home Medications           * promethazine-dextromethorphan (PHENERGAN DM) 6.25-15 mg/5 mL syrp syrup    1-2 teaspoons every 6 hours as needed for cough mainly at bedtime.  Do not take and drive.       Allergies Allergies[3]  Family History Family History[4]  Social History Social History[5]   Physical Exam   BP 122/78   Pulse 91   Temp 99 F (37.2 C)  (Oral)   Resp 18   Ht 170.2 cm (5' 7)   Wt 123 kg (271 lb 6.4 oz)   LMP 08/09/2024 (Approximate)   SpO2 91%   BMI 42.51 kg/m   Physical Exam Vitals and nursing note reviewed.  Constitutional:      General: She is not in acute distress.    Appearance: She is well-developed. She is not toxic-appearing or diaphoretic.  HENT:     Head: Normocephalic and atraumatic.     Right Ear: External ear normal.     Left Ear: External ear normal.  Eyes:     General: Lids are normal.        Right eye: No discharge.        Left eye: No discharge.  Cardiovascular:     Rate and Rhythm: Normal rate and regular rhythm.     Heart sounds: Normal heart sounds.  Pulmonary:     Effort: Pulmonary effort is normal.     Breath sounds: Normal breath sounds. No wheezing, rhonchi or rales.  Abdominal:     Palpations: Abdomen is soft.     Tenderness: There is abdominal tenderness (very minimal tenderness to palpation) in the right lower quadrant.  Musculoskeletal:     Cervical back: Normal range of motion and neck supple.  Skin:    General: Skin is warm.  Neurological:     General: No focal deficit present.     Mental Status: She is alert and oriented to person, place, and time.  Psychiatric:  Mood and Affect: Mood normal.        Behavior: Behavior normal.     Labs   Labs Reviewed  COMPREHENSIVE METABOLIC PANEL - Abnormal      Result Value   Sodium 134 (*)    Potassium 3.6     Chloride 102     CO2 27     Anion Gap 5 (*)    Glucose, Random 98     Blood Urea Nitrogen (BUN) 8     Creatinine 0.88     eGFR >90     Albumin 4.3     Total Protein 7.8     Bilirubin, Total 0.5     Alkaline Phosphatase (ALP) 50     Aspartate Aminotransferase (AST) 16     Alanine Aminotransferase (ALT) 15     Calcium  8.9     BUN/Creatinine Ratio      CBC WITH DIFFERENTIAL - Abnormal   WBC 5.17     RBC 4.45     Hemoglobin 12.0 (*)    Hematocrit 36.0     Mean Corpuscular Volume (MCV) 80.9     Mean  Corpuscular Hemoglobin (MCH) 26.9 (*)    Mean Corpuscular Hemoglobin Conc (MCHC) 33.2     Red Cell Distribution Width (RDW) 14.6     Platelet Count (PLT) 266     Mean Platelet Volume (MPV) 8.1     Neutrophils % 66     Lymphocytes % 20     Monocytes % 14     Eosinophils % 1     Basophils % 1     Neutrophils Absolute 3.40     Lymphocytes # 1.00     Monocytes # 0.70     Eosinophils # 0.00     Basophils # 0.00    SARS-COV-2, FLU, AND RSV, QUALITATIVE NAAT - Abnormal   SARS-CoV-2 Negative     Influenza A Positive (*)    Influenza B Negative     RSV Negative     Narrative:    Results are for use in the simultaneous rapid in vitro detection and differentiation of SARS-CoV-2, RSV, influenza A virus, and influenza B virus nucleic acids by PCR in clinical specimens.   Positive results are indicative of active infection but do not rule out bacterial infection or co-infection with other pathogens not detected by the test. Clinical correlation with patient history and other diagnostic information is necessary to determine patient infection status. The agent detected may not be the definite cause of disease.   Negative results do not preclude SARS-CoV-2, RSV, influenza A, and/or influenza B infection and should not be used as the sole basis for diagnosis, treatment or other patient management decisions. Negative results must be combined with clinical observations, patient history, and/or epidemiological information.   Test performed by [facility] qualified personnel using the Cepheid Xpert SARS-CoV-2 & Influenza A/B Nucleic acid test on the GeneXpert instrument.   POC HCG QUALITATIVE, URINE (AH)   HCG, Urine, POC Negative     Internal Control Acceptable     Kit/Device Lot # [id number]     Kit/Device Expiration Date 02/04/26       Radiology   Imaging: (Interpreted by me)  Radiology Results (last 72 hours)     Procedure Component Value Units Date/Time   CT Abdomen Pelvis W Contrast [[id number]]  Collected: 08/26/24 0107   Order Status: Completed Updated: 08/26/24 0108   Narrative:     Date of Service: 2024-08-25 21:58:00  EXAM:  CT Abdomen and Pelvis with Intravenous Contrast  CLINICAL HISTORY: RLQ abd pain.  TECHNIQUE: Axial computed tomography images of the abdomen and pelvis with intravenous contrast. CONTRAST: With; Contrast: 100 mL iohexoL (OMNIPAQUE) COMPARISON: None provided.  FINDINGS:  LUNG BASES: No basilar airspace consolidation or pleural effusion. LIVER: Liver is enlarged measuring 18.5 cm.  GALLBLADDER AND BILE DUCTS: Unremarkable. No calcified stone. No ductal dilation. PANCREAS: Unremarkable. SPLEEN: A 1.6 cm splenic cyst.  ADRENAL GLANDS: Unremarkable. KIDNEYS, URETERS, AND BLADDER: Mild to moderate bladder wall thickening, nonspecific.  No hydronephrosis or nephrolithiasis. No ureteral or bladder calculi. STOMACH AND BOWEL: No obstruction. No wall thickening. No CT evidence of colitis or acute diverticulitis. APPENDIX: No CT evidence for appendicitis. PERITONEUM: Small amount of pelvic fluid.  No free air. LYMPH NODES: No lymphadenopathy. REPRODUCTIVE: A 2.3 cm cm left-sided corpus luteal cyst.  VASCULATURE: No aortic aneurysm. BONES: No fracture or suspicious osseous abnormality. ABDOMINAL WALL AND SOFT TISSUES: Unremarkable.    Impression:      No acute intra-abdominal or pelvic abnormality.  No CT evidence for appendicitis.  Hepatomegaly.  Electronically signed by: Christin Curb, MD on 08/26/2024 01:07:14 AM US Robinette Per PQRS, all CT exams are performed using one or more of the following dose reduction techniques: automated exposure control, adjustment of the mA and/or kV according to patient size, or use of iterative reconstruction technique.           Medical Decision Making   Additional Information: I reviewed the patient's past medical history, including notes from our facility and what is available in  CareEverywhere.  External records were reviewed: Reviewed [facility] urgent care visit on 08/25/2024 for right lower quadrant abdominal pain.  Differentials considered: appendicitis, ovarian torsion, ovarian cyst, influenza, pyelonephritis, cystitis    Patient hooked up to cardiac telemetry for close monitoring.  Clinical Complexity:  Patient's presentation is most consistent with acute presentation with potential threat to life or bodily function.   Medical Decision Making Patient is a 28 year old female who presented to the ED for evaluation of abdominal pain onset 4 days ago.  Pain is localized to the right lower quadrant.  She went to urgent care was sent here for further evaluation.  She does report recent cough for 3 days.  On exam, patient is afebrile, nontoxic-appearing, stable vital signs.  She has very minimal tenderness to palpation in the right lower quadrant.  Labs are stable.  She has tested positive for influenza A which I suspect is likely cause of her symptoms.  CT scan negative for appendicitis or other acute abnormality.  I suspect abdominal pain is also due to influenza.  Will discharge patient with prescription for Tessalon Perles for cough and outpatient follow-up with PCP. Patient ready for d/c. Patient discharged home in stable condition. Parent/patient agrees with treatment plan and disposition. All questions answered. Strict ED return precautions dicussed with parent/patient. Reviewed discharge instructions in detail. Parent/patient verbalizes understanding and agrees with the plan and has no further questions or concerns.    Problems Addressed: Abdominal pain, RLQ: complicated acute illness or injury Influenza A: complicated acute illness or injury  Amount and/or Complexity of Data Reviewed External Data Reviewed: notes. Labs: ordered. Radiology: ordered and independent interpretation performed.  Risk Prescription drug management.     Diagnosis:   1. Influenza A   2. Abdominal pain, RLQ      Medication List     START taking these medications    benzonatate 100 mg capsule Commonly known as:  TESSALON Take 1 capsule (100 mg total) by mouth 3 (three) times a day as needed for cough.       ASK your doctor about these medications    promethazine-dextromethorphan 6.25-15 mg/5 mL Syrp syrup Commonly known as: PHENERGAN DM 1-2 teaspoons every 6 hours as needed for cough mainly at bedtime.  Do not take and drive.         Where to Get Your Medications     These medications were sent to [id number] [place], [place] - [address] - PHONE: [phone] - FAX: [phone]  [address] [place], [place] [address]    Phone: [phone]  benzonatate 100 mg capsule    FOLLOW UP Duwaine Lenon Lukes, FNP [address] [place] [address] [phone]  Schedule an appointment as soon as possible for a visit    [facility] -  EMERGENCY DEPARTMENT [address] [employer] [place]  [address] [phone] Go to  As needed  __________________________________________________________  (Please note that portions of this note have been completed with Dragon voice recognition software.  Efforts were made to correct any errors, but occasionally words are mis-transcribed.)       [1] No past medical history on file. [2] No past surgical history on file. [3] No Known Allergies [4] No family history on file. [5] Social History Tobacco Use   Smoking status: Never    Passive exposure: Never   Smokeless tobacco: Never  Vaping Use   Vaping status: Never Used  Substance Use Topics   Alcohol use: Never   Drug use: Never  *Some images could not be shown.

## 2024-09-10 ENCOUNTER — Other Ambulatory Visit: Payer: Self-pay

## 2024-09-10 ENCOUNTER — Emergency Department
Admission: EM | Admit: 2024-09-10 | Discharge: 2024-09-10 | Disposition: A | Discharge status: Home / Self Care | Payer: PRIVATE HEALTH INSURANCE | Attending: Emergency Medicine | Admitting: Emergency Medicine

## 2024-09-10 DIAGNOSIS — H5789 Other specified disorders of eye and adnexa: Secondary | ICD-10-CM | POA: Diagnosis present

## 2024-09-10 DIAGNOSIS — S0501XA Injury of conjunctiva and corneal abrasion without foreign body, right eye, initial encounter: Secondary | ICD-10-CM | POA: Diagnosis not present

## 2024-09-10 DIAGNOSIS — X58XXXA Exposure to other specified factors, initial encounter: Secondary | ICD-10-CM | POA: Insufficient documentation

## 2024-09-10 MED ORDER — ERYTHROMYCIN 5 MG/GM OP OINT: 1.0000 | TOPICAL_OINTMENT | Freq: Every day | OPHTHALMIC | 0 refills | Status: AC

## 2024-09-10 MED ORDER — TETRACAINE HCL 0.5 % OP SOLN
1.0000 [drp] | Freq: Once | OPHTHALMIC | Status: AC
  Administered 2024-09-10: 1 [drp] via OPHTHALMIC
  Filled 2024-09-10: qty 4

## 2024-09-10 MED ORDER — FLUORESCEIN SODIUM 1 MG OP STRP
1.0000 | ORAL_STRIP | Freq: Once | OPHTHALMIC | Status: AC
  Administered 2024-09-10: 1 via OPHTHALMIC
  Filled 2024-09-10: qty 1

## 2024-09-10 NOTE — Discharge Instructions (Signed)
 Use the ointment as prescribed.  Please follow-up with ophthalmology if your symptoms persist.  Please return for any new, worsening, or changing symptoms or other concerns.  It is a pleasure caring for you today.

## 2024-09-10 NOTE — ED Provider Notes (Signed)
 "  [facility] Provider Note    Event Date/Time   First MD Initiated Contact with Patient 09/10/24 [id number]     (approximate)   History   Eye Pain   HPI  Beth Newton is a 29 y.o. female who presents today for evaluation of right eye irritation.  Patient reports that she thinks that her son scratched her eye last night while he was sleeping in her bed.  She reports that the area is irritated and has tearing.  No light sensitivity.  She does not wear contact lenses  Patient Active Problem List   Diagnosis Date Noted   Vaginal delivery 12/14/2020   Anemia of pregnancy 12/14/2020   Gestational hypertension 12/13/2020   Indication for care in labor or delivery 12/11/2020   Alpha thalassemia silent carrier 12/11/2020   Supervision of low-risk pregnancy 05/08/2020          Physical Exam   Triage Vital Signs: ED Triage Vitals  Encounter Vitals Group     BP 09/10/24 0728 (!) 140/80     Girls Systolic BP Percentile --      Girls Diastolic BP Percentile --      Boys Systolic BP Percentile --      Boys Diastolic BP Percentile --      Pulse Rate 09/10/24 0728 82     Resp 09/10/24 0728 18     Temp 09/10/24 0728 98.3 F (36.8 C)     Temp src --      SpO2 09/10/24 0728 98 %     Weight 09/10/24 0727 272 lb (123.4 kg)     Height 09/10/24 0727 5' 7 (1.702 m)     Head Circumference --      Peak Flow --      Pain Score 09/10/24 0727 3     Pain Loc --      Pain Education --      Exclude from Growth Chart --     Most recent vital signs: Vitals:   09/10/24 0728  BP: (!) 140/80  Pulse: 82  Resp: 18  Temp: 98.3 F (36.8 C)  SpO2: 98%    Physical Exam Vitals and nursing note reviewed.  Constitutional:      General: Awake and alert. No acute distress.    Appearance: Normal appearance. The patient is normal weight.  HENT:     Head: Normocephalic and atraumatic.     Mouth: Mucous membranes are moist.  Eyes:     General: PERRL. Normal EOMs         Right eye: No discharge.    No hyphema or hypopyon.  She has a 2 mm area of fluorescein  uptake at the 6 to 7 o'clock position.  Negative Siedel sign.  Normal lids and lashes.  No periorbital swelling or erythema.    Left eye: No discharge.     Conjunctiva/sclera: Conjunctivae normal.  Cardiovascular:     Rate and Rhythm: Normal rate.     Pulses: Normal pulses.  Pulmonary:     Effort: Pulmonary effort is normal. No respiratory distress.  Abdominal:     Abdomen is soft.  Musculoskeletal:        General: No swelling. Normal range of motion.     Cervical back: Normal range of motion and neck supple.  Skin:    General: Skin is warm and dry.     Capillary Refill: Capillary refill takes less than 2 seconds.     Findings: No rash.  Neurological:     Mental Status: The patient is awake and alert.      ED Results / Procedures / Treatments   Labs (all labs ordered are listed, but only abnormal results are displayed) Labs Reviewed - No data to display   EKG     RADIOLOGY     PROCEDURES:  Critical Care performed:   Procedures   MEDICATIONS ORDERED IN ED: Medications  fluorescein  ophthalmic strip 1 strip (1 strip Right Eye Given 09/10/24 0821)  tetracaine  (PONTOCAINE) 0.5 % ophthalmic solution 1-2 drop (1 drop Right Eye Given 09/10/24 9177)     IMPRESSION / MDM / ASSESSMENT AND PLAN / ED COURSE  I reviewed the triage vital signs and the nursing notes.   Differential diagnosis includes, but is not limited to, corneal abrasion, keratitis, retained foreign body.  Patient is awake and alert, hemodynamically stable and afebrile.  She is nontoxic in appearance.  She denies any visual changes, no photophobia, not consistent with keratitis.  No headache, pupil is round and reactive to light, no eye pain, not consistent with acute angle-closure glaucoma.  Fluorescein  staining applied with 2 mm area of fluorescein  uptake consistent with corneal abrasion especially with known  scratch to her eye last night from her son.  Negative Sidel sign, not consistent with open globe.  There is no hyphema or hypopyon noted.  She does not wear contact lenses.  Patient was started on erythromycin  ointment.  She was instructed to follow-up with ophthalmology if her symptoms persist.  We discussed return precautions in the meantime.  Patient understands and agrees with plan.  Discharged in stable condition.   Patient's presentation is most consistent with acute complicated illness / injury requiring diagnostic workup.      FINAL CLINICAL IMPRESSION(S) / ED DIAGNOSES   Final diagnoses:  Abrasion of right cornea, initial encounter     Rx / DC Orders   ED Discharge Orders          Ordered    erythromycin  ophthalmic ointment  Daily at bedtime        09/10/24 0817             Note:  This document was prepared using Dragon voice recognition software and may include unintentional dictation errors.   Najai Waszak E, PA-C 09/10/24 1118  "

## 2024-09-10 NOTE — ED Notes (Signed)
 See triage note  Presents with right eye irritation  States it feels like she has something in eye  Right eye is slightly irritated

## 2024-09-10 NOTE — ED Triage Notes (Signed)
 Pt comes with right eye redness and irritated. Pt states little pain. Pt states last three days.
